# Patient Record
Sex: Male | Born: 1945 | Race: White | Hispanic: No | Marital: Married | Smoking: Former smoker
Health system: Southern US, Community
[De-identification: ages and names within clinical notes are randomized; demographics above are authoritative.]

## PROBLEM LIST (undated history)

## (undated) DIAGNOSIS — H9190 Unspecified hearing loss, unspecified ear: Secondary | ICD-10-CM

## (undated) DIAGNOSIS — M109 Gout, unspecified: Secondary | ICD-10-CM

## (undated) DIAGNOSIS — K2270 Barrett's esophagus without dysplasia: Secondary | ICD-10-CM

## (undated) DIAGNOSIS — K449 Diaphragmatic hernia without obstruction or gangrene: Secondary | ICD-10-CM

## (undated) DIAGNOSIS — K219 Gastro-esophageal reflux disease without esophagitis: Secondary | ICD-10-CM

## (undated) DIAGNOSIS — K811 Chronic cholecystitis: Secondary | ICD-10-CM

## (undated) DIAGNOSIS — Z860100 Personal history of colon polyps, unspecified: Secondary | ICD-10-CM

## (undated) HISTORY — DX: Gastro-esophageal reflux disease without esophagitis: K21.9

## (undated) HISTORY — PX: CHOLECYSTECTOMY: SHX55

## (undated) HISTORY — DX: Barrett's esophagus without dysplasia: K22.70

---
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
## 1984-12-30 HISTORY — PX: STAPEDES SURGERY: SHX789

## 2001-04-15 ENCOUNTER — Encounter: Payer: Self-pay | Admitting: Internal Medicine

## 2002-05-02 ENCOUNTER — Encounter: Payer: Self-pay | Admitting: Internal Medicine

## 2003-07-22 ENCOUNTER — Encounter: Payer: Self-pay | Admitting: Internal Medicine

## 2005-03-11 ENCOUNTER — Ambulatory Visit: Payer: Self-pay | Admitting: Endocrinology

## 2005-03-19 ENCOUNTER — Ambulatory Visit: Payer: Self-pay | Admitting: Gastroenterology

## 2005-03-21 ENCOUNTER — Ambulatory Visit: Payer: Self-pay | Admitting: Endocrinology

## 2006-05-16 ENCOUNTER — Ambulatory Visit: Payer: Self-pay | Admitting: Internal Medicine

## 2006-05-23 ENCOUNTER — Ambulatory Visit: Payer: Self-pay | Admitting: Internal Medicine

## 2006-08-25 ENCOUNTER — Ambulatory Visit: Payer: Self-pay | Admitting: Internal Medicine

## 2007-05-21 ENCOUNTER — Ambulatory Visit: Payer: Self-pay | Admitting: Internal Medicine

## 2007-05-21 LAB — CONVERTED CEMR LAB
ALT: 25 units/L (ref 0–40)
AST: 19 units/L (ref 0–37)
Albumin: 4.1 g/dL (ref 3.5–5.2)
Alkaline Phosphatase: 62 units/L (ref 39–117)
BUN: 15 mg/dL (ref 6–23)
Basophils Absolute: 0 10*3/uL (ref 0.0–0.1)
Basophils Relative: 0.3 % (ref 0.0–1.0)
Bilirubin, Direct: 0.1 mg/dL (ref 0.0–0.3)
CO2: 31 meq/L (ref 19–32)
Calcium: 9.5 mg/dL (ref 8.4–10.5)
Chloride: 105 meq/L (ref 96–112)
Cholesterol: 162 mg/dL (ref 0–200)
Creatinine, Ser: 1.2 mg/dL (ref 0.4–1.5)
Eosinophils Absolute: 0.1 10*3/uL (ref 0.0–0.6)
Eosinophils Relative: 1.5 % (ref 0.0–5.0)
GFR calc Af Amer: 80 mL/min
GFR calc non Af Amer: 66 mL/min
Glucose, Bld: 95 mg/dL (ref 70–99)
HCT: 48.2 % (ref 39.0–52.0)
HDL: 52.2 mg/dL (ref >39.0)
Hemoglobin: 16.3 g/dL (ref 13.0–17.0)
LDL Cholesterol: 89 mg/dL (ref 0–99)
Lymphocytes Relative: 24.1 % (ref 12.0–46.0)
MCHC: 33.7 g/dL (ref 30.0–36.0)
MCV: 91.5 fL (ref 78.0–100.0)
Monocytes Absolute: 0.6 10*3/uL (ref 0.2–0.7)
Monocytes Relative: 8 % (ref 3.0–11.0)
Neutro Abs: 5.3 10*3/uL (ref 1.4–7.7)
Neutrophils Relative %: 66.1 % (ref 43.0–77.0)
PSA: 0.76 ng/mL (ref 0.10–4.00)
Platelets: 248 10*3/uL (ref 150–400)
Potassium: 5 meq/L (ref 3.5–5.1)
RBC: 5.27 M/uL (ref 4.22–5.81)
RDW: 12 % (ref 11.5–14.6)
Sodium: 141 meq/L (ref 135–145)
TSH: 1.89 microintl units/mL (ref 0.35–5.50)
Total Bilirubin: 1.2 mg/dL (ref 0.3–1.2)
Total CHOL/HDL Ratio: 3.1
Total Protein: 7.2 g/dL (ref 6.0–8.3)
Triglycerides: 104 mg/dL (ref 0–149)
VLDL: 21 mg/dL (ref 0–40)
WBC: 7.9 10*3/uL (ref 4.5–10.5)

## 2007-05-27 ENCOUNTER — Ambulatory Visit: Payer: Self-pay | Admitting: Internal Medicine

## 2007-05-27 DIAGNOSIS — K219 Gastro-esophageal reflux disease without esophagitis: Secondary | ICD-10-CM

## 2007-05-27 DIAGNOSIS — E785 Hyperlipidemia, unspecified: Secondary | ICD-10-CM | POA: Insufficient documentation

## 2007-06-05 ENCOUNTER — Ambulatory Visit: Payer: Self-pay | Admitting: Internal Medicine

## 2007-12-11 ENCOUNTER — Telehealth (INDEPENDENT_AMBULATORY_CARE_PROVIDER_SITE_OTHER): Payer: Self-pay | Admitting: *Deleted

## 2008-05-20 ENCOUNTER — Ambulatory Visit: Payer: Self-pay | Admitting: Internal Medicine

## 2008-05-20 LAB — CONVERTED CEMR LAB
Albumin: 4.1 g/dL (ref 3.5–5.2)
Alkaline Phosphatase: 61 units/L (ref 39–117)
Bilirubin, Direct: 0.1 mg/dL (ref 0.0–0.3)
Blood in Urine, dipstick: NEGATIVE
Calcium: 9.5 mg/dL (ref 8.4–10.5)
Cholesterol: 157 mg/dL (ref 0–200)
Eosinophils Absolute: 0.1 10*3/uL (ref 0.0–0.7)
GFR calc Af Amer: 72 mL/min
GFR calc non Af Amer: 60 mL/min
HCT: 46.8 % (ref 39.0–52.0)
HDL: 51 mg/dL (ref >39.0)
Ketones, urine, test strip: NEGATIVE
MCHC: 33.5 g/dL (ref 30.0–36.0)
MCV: 91 fL (ref 78.0–100.0)
Monocytes Absolute: 0.6 10*3/uL (ref 0.1–1.0)
Nitrite: NEGATIVE
Platelets: 231 10*3/uL (ref 150–400)
Potassium: 4.9 meq/L (ref 3.5–5.1)
RDW: 12.3 % (ref 11.5–14.6)
Sodium: 136 meq/L (ref 135–145)
Specific Gravity, Urine: <1.005
TSH: 1.25 microintl units/mL (ref 0.35–5.50)
Total CHOL/HDL Ratio: 3.1
Triglycerides: 75 mg/dL (ref 0–149)
pH: 6.5

## 2008-06-24 ENCOUNTER — Ambulatory Visit: Payer: Self-pay | Admitting: Gastroenterology

## 2008-06-28 ENCOUNTER — Ambulatory Visit: Payer: Self-pay | Admitting: Internal Medicine

## 2008-07-08 ENCOUNTER — Ambulatory Visit: Payer: Self-pay | Admitting: Gastroenterology

## 2008-08-16 ENCOUNTER — Telehealth: Payer: Self-pay | Admitting: Internal Medicine

## 2008-08-17 ENCOUNTER — Ambulatory Visit: Payer: Self-pay | Admitting: Internal Medicine

## 2009-06-22 ENCOUNTER — Ambulatory Visit: Payer: Self-pay | Admitting: Internal Medicine

## 2009-06-22 LAB — CONVERTED CEMR LAB
ALT: 22 units/L (ref 0–53)
Alkaline Phosphatase: 71 units/L (ref 39–117)
BUN: 15 mg/dL (ref 6–23)
Bilirubin Urine: NEGATIVE
Bilirubin, Direct: 0.2 mg/dL (ref 0.0–0.3)
Blood in Urine, dipstick: NEGATIVE
CO2: 29 meq/L (ref 19–32)
Cholesterol: 148 mg/dL (ref 0–200)
GFR calc non Af Amer: 65.34 mL/min (ref >60)
Glucose, Bld: 85 mg/dL (ref 70–99)
Glucose, Urine, Semiquant: NEGATIVE
HCT: 42.9 % (ref 39.0–52.0)
Hemoglobin: 14.8 g/dL (ref 13.0–17.0)
Lymphs Abs: 1.6 10*3/uL (ref 0.7–4.0)
MCHC: 34.5 g/dL (ref 30.0–36.0)
MCV: 89.4 fL (ref 78.0–100.0)
Monocytes Relative: 9.7 % (ref 3.0–12.0)
Neutro Abs: 4.4 10*3/uL (ref 1.4–7.7)
Platelets: 179 10*3/uL (ref 150.0–400.0)
Potassium: 4.2 meq/L (ref 3.5–5.1)
RDW: 12.3 % (ref 11.5–14.6)
Sodium: 140 meq/L (ref 135–145)
Specific Gravity, Urine: 1.02
Total Protein: 6.8 g/dL (ref 6.0–8.3)
VLDL: 17 mg/dL (ref 0.0–40.0)
pH: 7

## 2009-06-26 ENCOUNTER — Telehealth: Payer: Self-pay | Admitting: Internal Medicine

## 2009-08-11 ENCOUNTER — Ambulatory Visit: Payer: Self-pay | Admitting: Internal Medicine

## 2012-10-28 ENCOUNTER — Other Ambulatory Visit: Payer: Self-pay | Admitting: Dermatology

## 2013-06-07 ENCOUNTER — Encounter: Payer: Self-pay | Admitting: Gastroenterology

## 2013-06-22 ENCOUNTER — Encounter: Payer: Self-pay | Admitting: Gastroenterology

## 2013-09-13 ENCOUNTER — Ambulatory Visit (AMBULATORY_SURGERY_CENTER): Payer: Self-pay | Admitting: *Deleted

## 2013-09-13 VITALS — Ht 71.0 in | Wt 175.0 lb

## 2013-09-13 DIAGNOSIS — Z86010 Personal history of colonic polyps: Secondary | ICD-10-CM

## 2013-09-13 MED ORDER — NA SULFATE-K SULFATE-MG SULF 17.5-3.13-1.6 GM/177ML PO SOLN: 1.0000 | Freq: Once | ORAL | Status: DC

## 2013-09-22 ENCOUNTER — Ambulatory Visit (INDEPENDENT_AMBULATORY_CARE_PROVIDER_SITE_OTHER): Payer: Medicare Other | Admitting: Gastroenterology

## 2013-09-22 ENCOUNTER — Encounter: Payer: Self-pay | Admitting: Gastroenterology

## 2013-09-22 VITALS — BP 136/90 | HR 72 | Ht 69.0 in | Wt 175.4 lb

## 2013-09-22 DIAGNOSIS — Z860100 Personal history of colon polyps, unspecified: Secondary | ICD-10-CM | POA: Insufficient documentation

## 2013-09-22 DIAGNOSIS — Z800 Family history of malignant neoplasm of digestive organs: Secondary | ICD-10-CM

## 2013-09-22 DIAGNOSIS — K219 Gastro-esophageal reflux disease without esophagitis: Secondary | ICD-10-CM

## 2013-09-22 NOTE — Patient Instructions (Addendum)

## 2013-09-22 NOTE — Assessment & Plan Note (Signed)
Patient is symptomatic despite taking daily omeprazole.  Recommendations #1 trial of dexilant 60 mg daily #2 upper endoscopy to rule out Barrett's esophagus, in view of patient's long history of  Reflux and concern about esophageal cancer with family history of esophageal cancer

## 2013-09-22 NOTE — Assessment & Plan Note (Signed)
Patient is due for a colonoscopy.

## 2013-09-22 NOTE — Progress Notes (Signed)
History of Present Illness: The patient is a 67 year old male with history of colon polyps and reflux here to discuss endoscopy.  He's had reflux for years for which he takes omeprazole.  He has at least 2 episodes of moderately severe pyrosis  weekly.  This is often followed by crampy upper abdominal pain.  He denies dysphagia.  Father died with esophageal cancer in his early 104s.  He developed colon cancer in his mid 76s.  Adenomatous polyps were removed in 2004.  2009 colonoscopy was normal.    Past Medical History  Diagnosis Date  . GERD (gastroesophageal reflux disease)   . Hyperlipidemia   . Colon polyps    Past Surgical History  Procedure Laterality Date  . Orif shoulder fracture Right 1965  . Stapedes surgery Right 1986  . Tonsillectomy  1954   family history includes Cancer in his maternal grandfather; Colon cancer (age of onset: 68) in his father; Esophageal cancer in his father; Hypertension in his mother. Current Outpatient Prescriptions  Medication Sig Dispense Refill  . fish oil-omega-3 fatty acids 1000 MG capsule Take 1 g by mouth daily.      . Multiple Vitamin (MULTIVITAMIN) tablet Take 1 tablet by mouth daily.      Marland Kitchen omeprazole (PRILOSEC) 20 MG capsule Take 20 mg by mouth daily.       No current facility-administered medications for this visit.   Allergies as of 09/22/2013  . (No Known Allergies)    reports that he has quit smoking. His smoking use included Pipe. He started smoking about 34 years ago. He has never used smokeless tobacco. He reports that  drinks alcohol. He reports that he does not use illicit drugs.     Review of Systems: Pertinent positive and negative review of systems were noted in the above HPI section. All other review of systems were otherwise negative.  Vital signs were reviewed in today's medical record Physical Exam: General: Well developed , well nourished, no acute distress Skin: anicteric Head: Normocephalic and atraumatic Eyes:   sclerae anicteric, EOMI Ears: Normal auditory acuity Mouth: No deformity or lesions Neck: Supple, no masses or thyromegaly Lungs: Clear throughout to auscultation Heart: Regular rate and rhythm; no murmurs, rubs or bruits Abdomen: Soft, non tender and non distended. No masses, hepatosplenomegaly or hernias noted. Normal Bowel sounds Rectal:deferred Musculoskeletal: Symmetrical with no gross deformities  Skin: No lesions on visible extremities Pulses:  Normal pulses noted Extremities: No clubbing, cyanosis, edema or deformities noted Neurological: Alert oriented x 4, grossly nonfocal Cervical Nodes:  No significant cervical adenopathy Inguinal Nodes: No significant inguinal adenopathy Psychological:  Alert and cooperative. Normal mood and affect

## 2013-09-27 ENCOUNTER — Encounter: Payer: Self-pay | Admitting: Gastroenterology

## 2013-09-27 ENCOUNTER — Ambulatory Visit (AMBULATORY_SURGERY_CENTER): Payer: Medicare Other | Admitting: Gastroenterology

## 2013-09-27 VITALS — BP 121/72 | HR 49 | Temp 96.2°F | Resp 18 | Ht 69.0 in | Wt 175.0 lb

## 2013-09-27 DIAGNOSIS — D131 Benign neoplasm of stomach: Secondary | ICD-10-CM

## 2013-09-27 DIAGNOSIS — K317 Polyp of stomach and duodenum: Secondary | ICD-10-CM

## 2013-09-27 DIAGNOSIS — K219 Gastro-esophageal reflux disease without esophagitis: Secondary | ICD-10-CM

## 2013-09-27 DIAGNOSIS — Z860100 Personal history of colon polyps, unspecified: Secondary | ICD-10-CM

## 2013-09-27 DIAGNOSIS — Z800 Family history of malignant neoplasm of digestive organs: Secondary | ICD-10-CM

## 2013-09-27 DIAGNOSIS — D126 Benign neoplasm of colon, unspecified: Secondary | ICD-10-CM

## 2013-09-27 DIAGNOSIS — K2270 Barrett's esophagus without dysplasia: Secondary | ICD-10-CM

## 2013-09-27 HISTORY — PX: COLONOSCOPY: SHX174

## 2013-09-27 MED ORDER — SODIUM CHLORIDE 0.9 % IV SOLN: 500.0000 mL | INTRAVENOUS | Status: DC

## 2013-09-27 MED ORDER — DEXLANSOPRAZOLE 60 MG PO CPDR: 60.0000 mg | DELAYED_RELEASE_CAPSULE | Freq: Every day | ORAL | Status: DC

## 2013-09-27 NOTE — Op Note (Signed)
[facility] [address] [place], [address]   ENDOSCOPY PROCEDURE REPORT  PATIENT: Ronald Mccormick, Ronald Mccormick.  MR#: [id number] BIRTHDATE: Apr 08, 1946 , 67  yrs. old GENDER: Male ENDOSCOPIST: Louis Meckel, MD REFERRED BY:  Benedetto Goad, MD PROCEDURE DATE:  09/27/2013 PROCEDURE:  EGD w/ biopsy ASA CLASS:     Class II INDICATIONS:  Barrett's screening. MEDICATIONS: There was residual sedation effect present from prior procedure, MAC sedation, administered by CRNA, and propofol (Diprivan) 100mg  IV TOPICAL ANESTHETIC:  DESCRIPTION OF PROCEDURE: After the risks benefits and alternatives of the procedure were thoroughly explained, informed consent was obtained.  The LB [id number] endoscope was introduced through the mouth and advanced to the third portion of the duodenum. Without limitations.  The instrument was slowly withdrawn as the mucosa was fully examined.      In the gastric cardia, fundus and body there were multiple inflammatory-appearing polyps measuring up to 12 mm.  Biopsies were taken. Z line was at approximately 34 cm and was irregular.  Gastric appearing epithelium was noted to up to this point.  Multiple biopsies were taken to rule out Barrett's esophagus. A small sliding hiatal hernia was present.   In the gastric cardia, fundus and body there were multiple inflammatory-appearing polyps measuring up to 12 mm.  Biopsies were taken. Re   The remainder of the exam was normal  COMPLICATIONS: There were no complications. ENDOSCOPIC IMPRESSION: 1.  multiple gastric polyps 2.  possible Barrett's esophagus 3.   The remainder of the upper endoscopy exam was otherwise normal  RECOMMENDATIONS: await biopsy findings REPEAT EXAM:  eSigned:  Louis Meckel, MD 09/27/2013 9:23 AM   CC:  PATIENT NAME:  Ronald Mccormick. MR#: [id number]

## 2013-09-27 NOTE — Progress Notes (Signed)
Called to room to assist during endoscopic procedure.  Patient ID and intended procedure confirmed with present staff. Received instructions for my participation in the procedure from the performing physician.  

## 2013-09-27 NOTE — Progress Notes (Signed)
Patient did not experience any of the following events: a burn prior to discharge; a fall within the facility; wrong site/side/patient/procedure/implant event; or a hospital transfer or hospital admission upon discharge from the facility. (G8907) Patient did not have preoperative order for IV antibiotic SSI prophylaxis. (G8918)  

## 2013-09-27 NOTE — Patient Instructions (Addendum)
YOU HAD AN ENDOSCOPIC PROCEDURE TODAY AT THE [facility]: Refer to the procedure report that was given to you for any specific questions about what was found during the examination.  If the procedure report does not answer your questions, please call your gastroenterologist to clarify.  If you requested that your care partner not be given the details of your procedure findings, then the procedure report has been included in a sealed envelope for you to review at your convenience later.  YOU SHOULD EXPECT: Some feelings of bloating in the abdomen. Passage of more gas than usual.  Walking can help get rid of the air that was put into your GI tract during the procedure and reduce the bloating. If you had a lower endoscopy (such as a colonoscopy or flexible sigmoidoscopy) you may notice spotting of blood in your stool or on the toilet paper. If you underwent a bowel prep for your procedure, then you may not have a normal bowel movement for a few days.  DIET: Your first meal following the procedure should be a light meal and then it is ok to progress to your normal diet.  A half-sandwich or bowl of soup is an example of a good first meal.  Heavy or fried foods are harder to digest and may make you feel nauseous or bloated.  Likewise meals heavy in dairy and vegetables can cause extra gas to form and this can also increase the bloating.  Drink plenty of fluids but you should avoid alcoholic beverages for 24 hours.  ACTIVITY: Your care partner should take you home directly after the procedure.  You should plan to take it easy, moving slowly for the rest of the day.  You can resume normal activity the day after the procedure however you should NOT DRIVE or use heavy machinery for 24 hours (because of the sedation medicines used during the test).    SYMPTOMS TO REPORT IMMEDIATELY: A gastroenterologist can be reached at any hour.  During normal business hours, 8:30 AM to 5:00 PM Monday through Friday,  call [phone].  After hours and on weekends, please call the GI answering service at [phone] who will take a message and have the physician on call contact you.   Following lower endoscopy (colonoscopy or flexible sigmoidoscopy):  Excessive amounts of blood in the stool  Significant tenderness or worsening of abdominal pains  Swelling of the abdomen that is new, acute  Fever of 100F or higher  Following upper endoscopy (EGD)  Vomiting of blood or coffee ground material  New chest pain or pain under the shoulder blades  Painful or persistently difficult swallowing  New shortness of breath  Fever of 100F or higher  Black, tarry-looking stools  FOLLOW UP: If any biopsies were taken you will be contacted by phone or by letter within the next 1-3 weeks.  Call your gastroenterologist if you have not heard about the biopsies in 3 weeks.  Our staff will call the home number listed on your records the next business day following your procedure to check on you and address any questions or concerns that you may have at that time regarding the information given to you following your procedure. This is a courtesy call and so if there is no answer at the home number and we have not heard from you through the emergency physician on call, we will assume that you have returned to your regular daily activities without incident.  SIGNATURES/CONFIDENTIALITY: You and/or your care   partner have signed paperwork which will be entered into your electronic medical record.  These signatures attest to the fact that that the information above on your After Visit Summary has been reviewed and is understood.  Full responsibility of the confidentiality of this discharge information lies with you and/or your care-partner.   Resume medications. Information given on polyps with discharge instructions. 

## 2013-09-27 NOTE — Op Note (Signed)
[facility] [address] [place], [address]   COLONOSCOPY PROCEDURE REPORT  PATIENT: Ronald, Mccormick.  MR#: [id number] BIRTHDATE: 1946-11-05 , 67  yrs. old GENDER: Male ENDOSCOPIST: Louis Meckel, MD REFERRED NF:AOZH Andrey Campanile, MD PROCEDURE DATE:  09/27/2013 PROCEDURE:   Colonoscopy with cold biopsy polypectomy First Screening Colonoscopy - Avg.  risk and is 50 yrs.  old or older - No.  Prior Negative Screening - Now for repeat screening. N/A  History of Adenoma - Now for follow-up colonoscopy & has been > or = to 3 yrs.  Yes hx of adenoma.  Has been 3 or more years since last colonoscopy.  Polyps Removed Today? Yes. ASA CLASS:   Class II INDICATIONS:Patient's immediate family history of colon cancer, Patient's personal history of adenomatous colon polyps, and 2004-adenomatous polyps.  2009 colonoscopy negative. MEDICATIONS: MAC sedation, administered by CRNA and propofol (Diprivan) 200mg  IV  DESCRIPTION OF PROCEDURE:   After the risks benefits and alternatives of the procedure were thoroughly explained, informed consent was obtained.  A digital rectal exam revealed no abnormalities of the rectum.   The LB [id number]  endoscope was introduced through the anus and advanced to the cecum, which was identified by both the appendix and ileocecal valve. No adverse events experienced.   The quality of the prep was excellent using Suprep  The instrument was then slowly withdrawn as the colon was fully examined.      COLON FINDINGS: A sessile polyp measuring 2 mm in size was found in the rectum.  A polypectomy was performed with cold forceps.   The colon mucosa was otherwise normal.  Retroflexed views revealed no abnormalities. The time to cecum=2 minutes 43 seconds.  Withdrawal time=9 minutes 34 seconds.  The scope was withdrawn and the procedure completed. COMPLICATIONS: There were no complications.  ENDOSCOPIC IMPRESSION: 1.   Sessile polyp measuring  2 mm in size was found in the rectum; polypectomy was performed with cold forceps 2.   The colon mucosa was otherwise normal  RECOMMENDATIONS: Given your significant family history of colon cancer, you should have a repeat colonoscopy in 5 years   eSigned:  Louis Meckel, MD 09/27/2013 9:17 AM   cc:

## 2013-09-28 ENCOUNTER — Telehealth: Payer: Self-pay | Admitting: *Deleted

## 2013-09-28 NOTE — Telephone Encounter (Signed)
  Follow up Call-  Call back number 09/27/2013  Post procedure Call Back phone  # [phone]  Permission to leave phone message Yes     Patient questions:  Do you have a fever, pain , or abdominal swelling? no Pain Score  0 *  Have you tolerated food without any problems? yes  Have you been able to return to your normal activities? yes  Do you have any questions about your discharge instructions: Diet   no Medications  no Follow up visit  no  Do you have questions or concerns about your Care? no  Actions: * If pain score is 4 or above: No action needed, pain <4.

## 2013-10-04 ENCOUNTER — Encounter: Payer: Self-pay | Admitting: Gastroenterology

## 2013-11-04 ENCOUNTER — Other Ambulatory Visit: Payer: Self-pay

## 2014-05-21 ENCOUNTER — Encounter (HOSPITAL_COMMUNITY): Payer: Self-pay | Admitting: Emergency Medicine

## 2014-05-21 ENCOUNTER — Emergency Department (HOSPITAL_COMMUNITY): Payer: Medicare Other

## 2014-05-21 ENCOUNTER — Inpatient Hospital Stay (HOSPITAL_COMMUNITY)
Admission: EM | Admit: 2014-05-21 | Discharge: 2014-05-24 | DRG: 287 | Disposition: A | Discharge status: Home / Self Care | Payer: Medicare Other | Attending: Cardiovascular Disease | Admitting: Cardiovascular Disease

## 2014-05-21 DIAGNOSIS — E785 Hyperlipidemia, unspecified: Secondary | ICD-10-CM | POA: Diagnosis present

## 2014-05-21 DIAGNOSIS — I200 Unstable angina: Secondary | ICD-10-CM | POA: Diagnosis present

## 2014-05-21 DIAGNOSIS — Z800 Family history of malignant neoplasm of digestive organs: Secondary | ICD-10-CM | POA: Diagnosis not present

## 2014-05-21 DIAGNOSIS — I2510 Atherosclerotic heart disease of native coronary artery without angina pectoris: Principal | ICD-10-CM | POA: Diagnosis present

## 2014-05-21 DIAGNOSIS — K449 Diaphragmatic hernia without obstruction or gangrene: Secondary | ICD-10-CM | POA: Diagnosis not present

## 2014-05-21 DIAGNOSIS — E7800 Pure hypercholesterolemia, unspecified: Secondary | ICD-10-CM | POA: Diagnosis present

## 2014-05-21 DIAGNOSIS — Z87891 Personal history of nicotine dependence: Secondary | ICD-10-CM

## 2014-05-21 DIAGNOSIS — Z8249 Family history of ischemic heart disease and other diseases of the circulatory system: Secondary | ICD-10-CM

## 2014-05-21 DIAGNOSIS — Z79899 Other long term (current) drug therapy: Secondary | ICD-10-CM | POA: Diagnosis not present

## 2014-05-21 DIAGNOSIS — Z860100 Personal history of colon polyps, unspecified: Secondary | ICD-10-CM

## 2014-05-21 DIAGNOSIS — N182 Chronic kidney disease, stage 2 (mild): Secondary | ICD-10-CM | POA: Diagnosis present

## 2014-05-21 DIAGNOSIS — K219 Gastro-esophageal reflux disease without esophagitis: Secondary | ICD-10-CM | POA: Diagnosis present

## 2014-05-21 DIAGNOSIS — Z823 Family history of stroke: Secondary | ICD-10-CM | POA: Diagnosis not present

## 2014-05-21 DIAGNOSIS — I249 Acute ischemic heart disease, unspecified: Secondary | ICD-10-CM | POA: Diagnosis present

## 2014-05-21 DIAGNOSIS — R079 Chest pain, unspecified: Secondary | ICD-10-CM | POA: Diagnosis present

## 2014-05-21 DIAGNOSIS — Z7982 Long term (current) use of aspirin: Secondary | ICD-10-CM | POA: Diagnosis not present

## 2014-05-21 LAB — LIPASE, BLOOD: Lipase: 28 U/L (ref 11–59)

## 2014-05-21 LAB — COMPREHENSIVE METABOLIC PANEL
ALT: 16 U/L (ref 0–53)
AST: 17 U/L (ref 0–37)
Albumin: 3.7 g/dL (ref 3.5–5.2)
Alkaline Phosphatase: 84 U/L (ref 39–117)
BUN: 18 mg/dL (ref 6–23)
CALCIUM: 9.6 mg/dL (ref 8.4–10.5)
CHLORIDE: 101 meq/L (ref 96–112)
CO2: 21 meq/L (ref 19–32)
Creatinine, Ser: 1.33 mg/dL (ref 0.50–1.35)
GFR calc Af Amer: 63 mL/min — ABNORMAL LOW (ref >90)
GFR, EST NON AFRICAN AMERICAN: 54 mL/min — AB (ref >90)
Glucose, Bld: 133 mg/dL — ABNORMAL HIGH (ref 70–99)
Potassium: 4.6 mEq/L (ref 3.7–5.3)
SODIUM: 139 meq/L (ref 137–147)
Total Bilirubin: 0.3 mg/dL (ref 0.3–1.2)
Total Protein: 7.4 g/dL (ref 6.0–8.3)

## 2014-05-21 LAB — CBC WITH DIFFERENTIAL/PLATELET
Basophils Absolute: 0 10*3/uL (ref 0.0–0.1)
Basophils Relative: 0 % (ref 0–1)
EOS PCT: 1 % (ref 0–5)
Eosinophils Absolute: 0 10*3/uL (ref 0.0–0.7)
HCT: 44.8 % (ref 39.0–52.0)
Hemoglobin: 15 g/dL (ref 13.0–17.0)
LYMPHS PCT: 22 % (ref 12–46)
Lymphs Abs: 1.8 10*3/uL (ref 0.7–4.0)
MCH: 29.4 pg (ref 26.0–34.0)
MCHC: 33.5 g/dL (ref 30.0–36.0)
MCV: 87.7 fL (ref 78.0–100.0)
Monocytes Absolute: 0.5 10*3/uL (ref 0.1–1.0)
Monocytes Relative: 6 % (ref 3–12)
NEUTROS ABS: 5.9 10*3/uL (ref 1.7–7.7)
Neutrophils Relative %: 71 % (ref 43–77)
PLATELETS: 284 10*3/uL (ref 150–400)
RBC: 5.11 MIL/uL (ref 4.22–5.81)
RDW: 12.8 % (ref 11.5–15.5)
WBC: 8.2 10*3/uL (ref 4.0–10.5)

## 2014-05-21 LAB — TROPONIN I
Troponin I: <0.3 ng/mL (ref <0.30)
Troponin I: <0.3 ng/mL (ref <0.30)
Troponin I: <0.3 ng/mL (ref <0.30)

## 2014-05-21 LAB — I-STAT TROPONIN, ED: TROPONIN I, POC: 0 ng/mL (ref 0.00–0.08)

## 2014-05-21 LAB — HEPARIN LEVEL (UNFRACTIONATED): HEPARIN UNFRACTIONATED: 0.53 [IU]/mL (ref 0.30–0.70)

## 2014-05-21 LAB — MRSA PCR SCREENING: MRSA by PCR: NEGATIVE

## 2014-05-21 MED ORDER — MORPHINE SULFATE 4 MG/ML IJ SOLN
4.0000 mg | INTRAMUSCULAR | Status: DC | PRN
  Administered 2014-05-21: 4 mg via INTRAVENOUS
  Filled 2014-05-21: qty 1

## 2014-05-21 MED ORDER — ASPIRIN EC 81 MG PO TBEC
81.0000 mg | DELAYED_RELEASE_TABLET | Freq: Every day | ORAL | Status: DC
  Administered 2014-05-22 – 2014-05-24 (×3): 81 mg via ORAL
  Filled 2014-05-21 (×3): qty 1

## 2014-05-21 MED ORDER — ATORVASTATIN CALCIUM 40 MG PO TABS
40.0000 mg | ORAL_TABLET | Freq: Every day | ORAL | Status: DC
  Administered 2014-05-21 – 2014-05-24 (×4): 40 mg via ORAL
  Filled 2014-05-21 (×4): qty 1

## 2014-05-21 MED ORDER — NITROGLYCERIN 0.4 MG SL SUBL: 0.4000 mg | SUBLINGUAL_TABLET | SUBLINGUAL | Status: DC | PRN

## 2014-05-21 MED ORDER — ASPIRIN 81 MG PO CHEW: 324.0000 mg | CHEWABLE_TABLET | ORAL | Status: AC

## 2014-05-21 MED ORDER — ADULT MULTIVITAMIN W/MINERALS CH
1.0000 | ORAL_TABLET | Freq: Every day | ORAL | Status: DC
  Administered 2014-05-22 – 2014-05-24 (×3): 1 via ORAL
  Filled 2014-05-21 (×3): qty 1

## 2014-05-21 MED ORDER — IOHEXOL 350 MG/ML SOLN
100.0000 mL | Freq: Once | INTRAVENOUS | Status: AC | PRN
  Administered 2014-05-21: 100 mL via INTRAVENOUS

## 2014-05-21 MED ORDER — ONDANSETRON HCL 4 MG/2ML IJ SOLN: 4.0000 mg | Freq: Four times a day (QID) | INTRAMUSCULAR | Status: DC | PRN

## 2014-05-21 MED ORDER — METOPROLOL TARTRATE 25 MG PO TABS
25.0000 mg | ORAL_TABLET | Freq: Two times a day (BID) | ORAL | Status: DC
  Administered 2014-05-21 – 2014-05-24 (×6): 25 mg via ORAL
  Filled 2014-05-21 (×7): qty 1

## 2014-05-21 MED ORDER — HEPARIN (PORCINE) IN NACL 100-0.45 UNIT/ML-% IJ SOLN
950.0000 [IU]/h | INTRAMUSCULAR | Status: DC
  Administered 2014-05-21 – 2014-05-23 (×2): 950 [IU]/h via INTRAVENOUS
  Filled 2014-05-21 (×4): qty 250

## 2014-05-21 MED ORDER — ACETAMINOPHEN 325 MG PO TABS: 650.0000 mg | ORAL_TABLET | ORAL | Status: DC | PRN

## 2014-05-21 MED ORDER — ONDANSETRON HCL 4 MG/2ML IJ SOLN
4.0000 mg | Freq: Once | INTRAMUSCULAR | Status: AC
  Administered 2014-05-21: 4 mg via INTRAVENOUS
  Filled 2014-05-21: qty 2

## 2014-05-21 MED ORDER — ONE-DAILY MULTI VITAMINS PO TABS: 1.0000 | ORAL_TABLET | Freq: Every day | ORAL | Status: DC

## 2014-05-21 MED ORDER — HEPARIN BOLUS VIA INFUSION
4000.0000 [IU] | Freq: Once | INTRAVENOUS | Status: AC
  Administered 2014-05-21: 4000 [IU] via INTRAVENOUS
  Filled 2014-05-21: qty 4000

## 2014-05-21 MED ORDER — SODIUM CHLORIDE 0.9 % IV SOLN
INTRAVENOUS | Status: DC
  Administered 2014-05-23 – 2014-05-24 (×3): via INTRAVENOUS

## 2014-05-21 MED ORDER — DEXLANSOPRAZOLE 60 MG PO CPDR
60.0000 mg | DELAYED_RELEASE_CAPSULE | Freq: Every day | ORAL | Status: DC
  Administered 2014-05-22 – 2014-05-24 (×3): 60 mg via ORAL
  Filled 2014-05-21 (×4): qty 1

## 2014-05-21 MED ORDER — NON FORMULARY: 60.0000 mg | Freq: Every morning | Status: DC

## 2014-05-21 MED ORDER — ASPIRIN 300 MG RE SUPP
300.0000 mg | RECTAL | Status: AC
  Filled 2014-05-21: qty 1

## 2014-05-21 NOTE — Progress Notes (Signed)
ANTICOAGULATION CONSULT NOTE - Initial Consult  Pharmacy Consult for Heparin Indication: chest pain/ACS  No Known Allergies  Patient Measurements: Height: 5\' 10"  (177.8 cm) Weight: 174 lb (78.926 kg) IBW/kg (Calculated) : 73  Vital Signs: Temp: 97.7 F (36.5 C) (05/23 1445) Temp src: Oral (05/23 1300) BP: 169/94 mmHg (05/23 1445) Pulse Rate: 59 (05/23 1445)  Labs:  Recent Labs  05/21/14 0931 05/21/14 0936  HGB  --  15.0  HCT  --  44.8  PLT  --  284  CREATININE  --  1.33  TROPONINI <0.30  --     Estimated Creatinine Clearance: 56.4 ml/min (by C-G formula based on Cr of 1.33).   Medical History: Past Medical History  Diagnosis Date  . GERD (gastroesophageal reflux disease)   . Hyperlipidemia   . Colon polyps     Medications:  Scheduled:  . aspirin  324 mg Oral NOW   Or  . aspirin  300 mg Rectal NOW  . [START ON 05/22/2014] aspirin EC  81 mg Oral Daily  . atorvastatin  40 mg Oral q1800  . dexlansoprazole  60 mg Oral Daily  . metoprolol tartrate  25 mg Oral BID  . [START ON 05/22/2014] multivitamin with minerals  1 tablet Oral Daily   Infusions:  . sodium chloride      Assessment: 68 yo M presenting on 5/23 with pain between shoulder blades to start heparin drip for r/o ACS. CBC wnl.  Goal of Therapy:  Heparin level 0.3-0.7 units/ml Monitor platelets by anticoagulation protocol: Yes   Plan:  - Heparin bolus 4000 units x 1, followed by 950 units/hr (~12 u/kg/hr) - F/u 6 hr HL, then daily HL - Monitor CBC, s/s bleeding  Harolyn Rutherford, PharmD Clinical Pharmacist - Resident Pager: [phone] Pharmacy: [phone] 05/21/2014 3:26 PM

## 2014-05-21 NOTE — H&P (Signed)
Referring Physician:  BAIN WHICHARD is an 68 y.o. male.                       Chief Complaint: Chest pain HPI:  68 year old male with pain between his shoulder blades started this AM. Had heavy exertion yesterday. The pain began to radiate to his bilateral axilla. None to the center anterior chest. No radiation to the neck or jaw. No upper extremity pain radiation. He was not short of breath. He was nauseated. His nausea is improved. His pain has not. No past similar episodes. He takes dsxilant for reflux. It in no way feels like his reflux. No recent exertional pain. No falls injuries or trauma. No recent food intolerance. Currently non smoker, no alcohol use.    Past Medical History  Diagnosis Date  . GERD (gastroesophageal reflux disease)   . Hyperlipidemia   . Colon polyps       Past Surgical History  Procedure Laterality Date  . Orif shoulder fracture Right 1965  . Stapedes surgery Right 1986  . Tonsillectomy  1954    Family History  Problem Relation Age of Onset  . Colon cancer Father 92  . Esophageal cancer Father   . Hypertension Mother   . Stroke Mother   . Cancer Maternal Grandfather     mets   Social History:  reports that he has quit smoking. His smoking use included Pipe. He started smoking about 35 years ago. He has never used smokeless tobacco. He reports that he drinks about 8.4 ounces of alcohol per week. He reports that he does not use illicit drugs.  Allergies: No Known Allergies   (Not in a hospital admission)  Results for orders placed during the hospital encounter of 05/21/14 (from the past 48 hour(s))  TROPONIN I     Status: None   Collection Time    05/21/14  9:31 AM      Result Value Ref Range   Troponin I <0.30  <0.30 ng/mL   Comment:            Due to the release kinetics of cTnI,     a negative result within the first hours     of the onset of symptoms does not rule out     myocardial infarction with certainty.     If myocardial  infarction is still suspected,     repeat the test at appropriate intervals.  CBC WITH DIFFERENTIAL     Status: None   Collection Time    05/21/14  9:36 AM      Result Value Ref Range   WBC 8.2  4.0 - 10.5 K/uL   RBC 5.11  4.22 - 5.81 MIL/uL   Hemoglobin 15.0  13.0 - 17.0 g/dL   HCT 44.8  39.0 - 52.0 %   MCV 87.7  78.0 - 100.0 fL   MCH 29.4  26.0 - 34.0 pg   MCHC 33.5  30.0 - 36.0 g/dL   RDW 12.8  11.5 - 15.5 %   Platelets 284  150 - 400 K/uL   Neutrophils Relative % 71  43 - 77 %   Neutro Abs 5.9  1.7 - 7.7 K/uL   Lymphocytes Relative 22  12 - 46 %   Lymphs Abs 1.8  0.7 - 4.0 K/uL   Monocytes Relative 6  3 - 12 %   Monocytes Absolute 0.5  0.1 - 1.0 K/uL   Eosinophils Relative 1  0 - 5 %   Eosinophils Absolute 0.0  0.0 - 0.7 K/uL   Basophils Relative 0  0 - 1 %   Basophils Absolute 0.0  0.0 - 0.1 K/uL  COMPREHENSIVE METABOLIC PANEL     Status: Abnormal   Collection Time    05/21/14  9:36 AM      Result Value Ref Range   Sodium 139  137 - 147 mEq/L   Potassium 4.6  3.7 - 5.3 mEq/L   Chloride 101  96 - 112 mEq/L   CO2 21  19 - 32 mEq/L   Glucose, Bld 133 (*) 70 - 99 mg/dL   BUN 18  6 - 23 mg/dL   Creatinine, Ser 1.33  0.50 - 1.35 mg/dL   Calcium 9.6  8.4 - 10.5 mg/dL   Total Protein 7.4  6.0 - 8.3 g/dL   Albumin 3.7  3.5 - 5.2 g/dL   AST 17  0 - 37 U/L   ALT 16  0 - 53 U/L   Alkaline Phosphatase 84  39 - 117 U/L   Total Bilirubin 0.3  0.3 - 1.2 mg/dL   GFR calc non Af Amer 54 (*) >90 mL/min   GFR calc Af Amer 63 (*) >90 mL/min   Comment: (NOTE)     The eGFR has been calculated using the CKD EPI equation.     This calculation has not been validated in all clinical situations.     eGFR's persistently <90 mL/min signify possible Chronic Kidney     Disease.  LIPASE, BLOOD     Status: None   Collection Time    05/21/14  9:36 AM      Result Value Ref Range   Lipase 28  11 - 59 U/L  I-STAT TROPOININ, ED     Status: None   Collection Time    05/21/14  1:07 PM       Result Value Ref Range   Troponin i, poc 0.00  0.00 - 0.08 ng/mL   Comment 3            Comment: Due to the release kinetics of cTnI,     a negative result within the first hours     of the onset of symptoms does not rule out     myocardial infarction with certainty.     If myocardial infarction is still suspected,     repeat the test at appropriate intervals.   Ct Angio Chest Aorta W/cm &/or Wo/cm  05/21/2014   CLINICAL DATA:  Upper back pain extending to the chest  EXAM: CT ANGIOGRAPHY CHEST, ABDOMEN AND PELVIS  TECHNIQUE: Multidetector CT imaging through the chest, abdomen and pelvis was performed using the standard protocol during bolus administration of intravenous contrast. Multiplanar reconstructed images and MIPs were obtained and reviewed to evaluate the vascular anatomy.  CONTRAST:  138m OMNIPAQUE IOHEXOL 350 MG/ML SOLN  COMPARISON:  None.  FINDINGS: CTA CHEST FINDINGS  Mediastinum: Unremarkable CT appearance of the thyroid gland. No suspicious mediastinal or hilar adenopathy. No soft tissue mediastinal mass. Small hiatal hernia.  Heart/Vascular: No evidence of acute intramural hematoma non contrasted images. Conventional 3 vessel aortic arch. There is a ectasia of the origins of the great vessels. Specifically, the origin of the left subclavian artery is 1.6 cm in diameter. The ascending thoracic aorta is also ectatic with a maximal diameter of 4.1 cm just proximal to the transverse segment. The aortic root is within normal limits. No effacement of the  sino-tubular junction. Cardiac structures are within normal limits for size. No pericardial effusion. Mild atherosclerotic calcification present in left anterior descending coronary artery. No large central PE. Main and central pulmonary arteries are within normal limits for size.  Lungs/Pleura: Small left Bochdalek's hernia containing retroperitoneal fat. Biapical pleural parenchymal scarring with emphysematous change. Mild paraseptal emphysema  confined to the upper lobes. 6 mm nodular opacity in the right middle lobe (image 76 series 6) dependent atelectasis noted in the lower lobes bilaterally. Respiratory motion slightly limits evaluation for nodules in the bases.  Bones/Soft Tissues: No acute fracture or aggressive appearing lytic or blastic osseous lesion.  Review of the MIP images confirms the above findings.  CTA ABDOMEN AND PELVIS FINDINGS  VASCULAR  Aorta: Normal caliber aorta without aneurysmal dilatation, dissection or significant atherosclerotic vascular plaque.  Celiac: Widely patent. Conventional hepatic arterial anatomy. No aneurysmal dilatation.  SMA: Widely patent.  Renals: 2 left and single right renal arteries are patent and unremarkable. No changes of fibromuscular dysplasia or aneurysmal dilatation.  IMA: Widely patent and unremarkable.  Inflow: Widely patent and unremarkable  Proximal Outflow: Widely patent and unremarkable  Veins: No focal venous abnormality.  Review of the MIP images confirms the above findings.  NON-VASCULAR  Abdomen: Unremarkable CT appearance of the stomach, duodenum, spleen, adrenal glands and pancreas. Normal hepatic contours and morphology. No discrete hepatic lesion. A single stone is present within the gallbladder neck. No secondary signs to suggest acute cholecystitis. No intra or extrahepatic biliary ductal dilatation.  Unremarkable appearance of the bilateral kidneys. No focal solid lesion, hydronephrosis or nephrolithiasis. 2.1 cm exophytic simple cyst from the lower pole of the left kidney No evidence of obstruction or focal bowel wall thickening. Normal appendix in the right lower quadrant. The terminal ileum is unremarkable. No free fluid or suspicious adenopathy.  Pelvis: Prostatomegaly. The prostate gland measures 5.4 cm transversely. The bladder is markedly distended with urine. Unremarkable seminal vesicles. Small bilateral fat containing inguinal hernias.  Bones/Soft Tissues: No acute fracture or  aggressive appearing lytic or blastic osseous lesion. Circumscribed 9 mm sclerotic focus in the right iliac bone just above the acetabulum has a narrow zone of transition and is favored to represent a benign bone island. Mild L4-L5, and L5-S1 degenerative disc disease.  IMPRESSION: VASCULAR:  1. Negative for acute aortic pathology. 2. Mild ectasia of the ascending thoracic aorta to a maximal diameter of 4.1 cm. 3. Mild ectasia of the origin of the left subclavian artery to 1.6 cm. NON VASCULAR  1. No acute abnormality. 2. Nonspecific 6 mm right middle lobe nodular opacity is favored to reflect a small focus of scarring. If the patient is at high risk for bronchogenic carcinoma, follow-up chest CT at 6-12 months is recommended. If the patient is at low risk for bronchogenic carcinoma, follow-up chest CT at 12 months is recommended. This recommendation follows the consensus statement: Guidelines for Management of Small Pulmonary Nodules Detected on CT Scans: A Statement from the [place] as published in Radiology 2005;237:395-400. 3. Mild by apical paraseptal emphysema and pleural parenchymal scarring. 4. Cholelithiasis without evidence of acute cholecystitis. 5. The bladder is markedly distended with urine. 6. Small bilateral fat containing inguinal hernias and left Bochdalek's hernia. 7. Circumscribed 9 mm sclerotic focus in the right iliac bone just superior to the acetabulum is favored to represent a benign bone island or other fibro-osseous lesion. However, if the patient has a known history of prostate cancer, a blastic metastasis would be difficult to  exclude entirely. 8. Small hiatal hernia Signed,  Criselda Peaches, MD  [facility]  [facility]   Electronically Signed   By: Jacqulynn Cadet M.D.   On: 05/21/2014 12:04   Ct Angio Abd/pel W/ And/or W/o  05/21/2014   CLINICAL DATA:  Upper back pain extending to the chest  EXAM: CT ANGIOGRAPHY CHEST,  ABDOMEN AND PELVIS  TECHNIQUE: Multidetector CT imaging through the chest, abdomen and pelvis was performed using the standard protocol during bolus administration of intravenous contrast. Multiplanar reconstructed images and MIPs were obtained and reviewed to evaluate the vascular anatomy.  CONTRAST:  173m OMNIPAQUE IOHEXOL 350 MG/ML SOLN  COMPARISON:  None.  FINDINGS: CTA CHEST FINDINGS  Mediastinum: Unremarkable CT appearance of the thyroid gland. No suspicious mediastinal or hilar adenopathy. No soft tissue mediastinal mass. Small hiatal hernia.  Heart/Vascular: No evidence of acute intramural hematoma non contrasted images. Conventional 3 vessel aortic arch. There is a ectasia of the origins of the great vessels. Specifically, the origin of the left subclavian artery is 1.6 cm in diameter. The ascending thoracic aorta is also ectatic with a maximal diameter of 4.1 cm just proximal to the transverse segment. The aortic root is within normal limits. No effacement of the sino-tubular junction. Cardiac structures are within normal limits for size. No pericardial effusion. Mild atherosclerotic calcification present in left anterior descending coronary artery. No large central PE. Main and central pulmonary arteries are within normal limits for size.  Lungs/Pleura: Small left Bochdalek's hernia containing retroperitoneal fat. Biapical pleural parenchymal scarring with emphysematous change. Mild paraseptal emphysema confined to the upper lobes. 6 mm nodular opacity in the right middle lobe (image 76 series 6) dependent atelectasis noted in the lower lobes bilaterally. Respiratory motion slightly limits evaluation for nodules in the bases.  Bones/Soft Tissues: No acute fracture or aggressive appearing lytic or blastic osseous lesion.  Review of the MIP images confirms the above findings.  CTA ABDOMEN AND PELVIS FINDINGS  VASCULAR  Aorta: Normal caliber aorta without aneurysmal dilatation, dissection or significant  atherosclerotic vascular plaque.  Celiac: Widely patent. Conventional hepatic arterial anatomy. No aneurysmal dilatation.  SMA: Widely patent.  Renals: 2 left and single right renal arteries are patent and unremarkable. No changes of fibromuscular dysplasia or aneurysmal dilatation.  IMA: Widely patent and unremarkable.  Inflow: Widely patent and unremarkable  Proximal Outflow: Widely patent and unremarkable  Veins: No focal venous abnormality.  Review of the MIP images confirms the above findings.  NON-VASCULAR  Abdomen: Unremarkable CT appearance of the stomach, duodenum, spleen, adrenal glands and pancreas. Normal hepatic contours and morphology. No discrete hepatic lesion. A single stone is present within the gallbladder neck. No secondary signs to suggest acute cholecystitis. No intra or extrahepatic biliary ductal dilatation.  Unremarkable appearance of the bilateral kidneys. No focal solid lesion, hydronephrosis or nephrolithiasis. 2.1 cm exophytic simple cyst from the lower pole of the left kidney No evidence of obstruction or focal bowel wall thickening. Normal appendix in the right lower quadrant. The terminal ileum is unremarkable. No free fluid or suspicious adenopathy.  Pelvis: Prostatomegaly. The prostate gland measures 5.4 cm transversely. The bladder is markedly distended with urine. Unremarkable seminal vesicles. Small bilateral fat containing inguinal hernias.  Bones/Soft Tissues: No acute fracture or aggressive appearing lytic or blastic osseous lesion. Circumscribed 9 mm sclerotic focus in the right iliac bone just above the acetabulum has a narrow zone of transition and is favored to represent a benign bone island. Mild  L4-L5, and L5-S1 degenerative disc disease.  IMPRESSION: VASCULAR:  1. Negative for acute aortic pathology. 2. Mild ectasia of the ascending thoracic aorta to a maximal diameter of 4.1 cm. 3. Mild ectasia of the origin of the left subclavian artery to 1.6 cm. NON VASCULAR  1. No  acute abnormality. 2. Nonspecific 6 mm right middle lobe nodular opacity is favored to reflect a small focus of scarring. If the patient is at high risk for bronchogenic carcinoma, follow-up chest CT at 6-12 months is recommended. If the patient is at low risk for bronchogenic carcinoma, follow-up chest CT at 12 months is recommended. This recommendation follows the consensus statement: Guidelines for Management of Small Pulmonary Nodules Detected on CT Scans: A Statement from the [place] as published in Radiology 2005;237:395-400. 3. Mild by apical paraseptal emphysema and pleural parenchymal scarring. 4. Cholelithiasis without evidence of acute cholecystitis. 5. The bladder is markedly distended with urine. 6. Small bilateral fat containing inguinal hernias and left Bochdalek's hernia. 7. Circumscribed 9 mm sclerotic focus in the right iliac bone just superior to the acetabulum is favored to represent a benign bone island or other fibro-osseous lesion. However, if the patient has a known history of prostate cancer, a blastic metastasis would be difficult to exclude entirely. 8. Small hiatal hernia Signed,  Criselda Peaches, MD  [facility]  [facility]   Electronically Signed   By: Jacqulynn Cadet M.D.   On: 05/21/2014 12:04     Review of Systems  Constitutional: Negative for fever, chills, diaphoresis, appetite change and fatigue.  HENT: Negative for mouth sores, sore throat and trouble swallowing.  Eyes: Negative for visual disturbance.  Respiratory: Negative for cough, chest tightness, shortness of breath and wheezing.  Cardiovascular: Negative for chest pain.  Gastrointestinal: Positive for nausea. Negative for vomiting, abdominal pain, diarrhea and abdominal distention.  Endocrine: Negative for polydipsia, polyphagia and polyuria.  Genitourinary: Negative for dysuria, frequency and hematuria.  Musculoskeletal: Positive for back pain.  Negative for gait problem.  Skin: Negative for color change, pallor and rash.  Neurological: Negative for dizziness, syncope, light-headedness and headaches.  Hematological: Does not bruise/bleed easily.  Psychiatric/Behavioral: Negative for behavioral problems and confusion.    Blood pressure 123/83, pulse 65, temperature 97.7 F (36.5 C), temperature source Oral, resp. rate 11, SpO2 97.00%.  Physical Exam  Constitutional: He is oriented to person, place, and time. He appears well-developed and well-nourished. No distress.  HENT: Head: Normocephalic. Eyes: Conjunctivae are normal. Pupils are equal, round, and reactive to light. No scleral icterus.  Neck: Normal range of motion. Neck supple. No thyromegaly present. No JVD or bruits  Cardiovascular: Normal rate, regular rhythm, S1 normal and S2 normal. Exam reveals no gallop and no friction rub. No murmur heard. No aortic insufficiency murmur.  Pulmonary/Chest: Effort normal and breath sounds normal.  Abdominal: Soft. Bowel sounds are increased. There is no tenderness. There is no rebound.  Musculoskeletal: Normal range of motion.  Neurological: He is alert and oriented to person, place, and time.  Skin: Skin is warm and dry. No rash noted.  Psychiatric: He has a normal mood and affect. His behavior is normal.  Assessment/Plan Acute coronary syndrome Tobacco use disorder GERD Hyperlipidemia H/O colon polyps  Admit R/O MI  Birdie Riddle 05/21/2014, 2:02 PM

## 2014-05-21 NOTE — ED Notes (Signed)
Pt reports onset this am of back in mid back, then began radiating around to under both arms. Having nausea, no sob, denies cough. ekg done at triage.

## 2014-05-21 NOTE — Progress Notes (Signed)
ANTICOAGULATION CONSULT NOTE - Follow Up Consult  Pharmacy Consult for heparin  Indication: chest pain/ACS  No Known Allergies  Patient Measurements: Height: 5\' 10"  (177.8 cm) Weight: 174 lb (78.926 kg) IBW/kg (Calculated) : 73 Heparin Dosing Weight:   Vital Signs: Temp: 97.9 F (36.6 C) (05/23 1930) Temp src: Oral (05/23 1930) BP: 150/94 mmHg (05/23 1930) Pulse Rate: 64 (05/23 1930)  Labs:  Recent Labs  05/21/14 0931 05/21/14 0936 05/21/14 1535 05/21/14 2137  HGB  --  15.0  --   --   HCT  --  44.8  --   --   PLT  --  284  --   --   HEPARINUNFRC  --   --   --  0.53  CREATININE  --  1.33  --   --   TROPONINI <0.30  --  <0.30 <0.30    Estimated Creatinine Clearance: 56.4 ml/min (by C-G formula based on Cr of 1.33).   Medications:  Heparin infusing at 950/hr   Assessment: Heparin is thereapeutic at 0.53 IU/ml. No bleeding reported.  Goal of Therapy:  Heparin level 0.3-0.7 units/ml Monitor platelets by anticoagulation protocol: Yes   Plan:  Continue at 950/hr and f/u daily labs  Curlene Dolphin 05/21/2014,11:06 PM

## 2014-05-21 NOTE — ED Provider Notes (Signed)
CSN: [id number]     Arrival date & time 05/21/14  0900 History   First MD Initiated Contact with Patient 05/21/14 [phone]     Chief Complaint  Patient presents with  . Back Pain  . Nausea      HPI  Patient presents with pain between his shoulder blades. He states that he was up and around about 8:00 this morning. He was sitting on a porch. He developed pain in the midline back. Midline, in the thoracic region, between the shoulder blades. It began to radiate to his bilateral axilla. None to the center anterior chest. No radiation to the neck or jaw. No upper extremity pain radiation. He was not short of breath. Was nauseated. His nausea is improved. His pain has not. No past similar episodes. He takes dsxilant for reflux. It in no way feels like his reflux. No recent exertional pain. No falls injuries or trauma. No recent food intolerance. Nonsmoker, no alcohol use.   Past Medical History  Diagnosis Date  . GERD (gastroesophageal reflux disease)   . Hyperlipidemia   . Colon polyps    Past Surgical History  Procedure Laterality Date  . Orif shoulder fracture Right 1965  . Stapedes surgery Right 1986  . Tonsillectomy  1954   Family History  Problem Relation Age of Onset  . Colon cancer Father 48  . Esophageal cancer Father   . Hypertension Mother   . Stroke Mother   . Cancer Maternal Grandfather     mets   History  Substance Use Topics  . Smoking status: Former Smoker    Types: Pipe    Start date: 12/30/1978  . Smokeless tobacco: Never Used  . Alcohol Use: 8.4 oz/week    14 Cans of beer per week     Comment: 1-2 per day    Review of Systems  Constitutional: Negative for fever, chills, diaphoresis, appetite change and fatigue.  HENT: Negative for mouth sores, sore throat and trouble swallowing.   Eyes: Negative for visual disturbance.  Respiratory: Negative for cough, chest tightness, shortness of breath and wheezing.   Cardiovascular: Negative for chest pain.   Gastrointestinal: Positive for nausea. Negative for vomiting, abdominal pain, diarrhea and abdominal distention.  Endocrine: Negative for polydipsia, polyphagia and polyuria.  Genitourinary: Negative for dysuria, frequency and hematuria.  Musculoskeletal: Positive for back pain. Negative for gait problem.  Skin: Negative for color change, pallor and rash.  Neurological: Negative for dizziness, syncope, light-headedness and headaches.  Hematological: Does not bruise/bleed easily.  Psychiatric/Behavioral: Negative for behavioral problems and confusion.      Allergies  Review of patient's allergies indicates no known allergies.  Home Medications   Prior to Admission medications   Medication Sig Start Date End Date Taking? Authorizing Provider  aspirin 325 MG tablet Take 650 mg by mouth once.   Yes Historical Provider, MD  dexlansoprazole (DEXILANT) 60 MG capsule Take 1 capsule (60 mg total) by mouth daily. 09/27/13  Yes Inda Castle, MD  fish oil-omega-3 fatty acids 1000 MG capsule Take 1 g by mouth daily.   Yes Historical Provider, MD  Multiple Vitamin (MULTIVITAMIN) tablet Take 1 tablet by mouth daily.   Yes Historical Provider, MD   BP 123/83  Pulse 65  Temp(Src) 97.7 F (36.5 C) (Oral)  Resp 11  SpO2 97% Physical Exam  Constitutional: He is oriented to person, place, and time. He appears well-developed and well-nourished. No distress.  HENT:  Head: Normocephalic.  Eyes: Conjunctivae are normal. Pupils  are equal, round, and reactive to light. No scleral icterus.  Neck: Normal range of motion. Neck supple. No thyromegaly present.  No JVD or bruits  Cardiovascular: Normal rate, regular rhythm, S1 normal and S2 normal.  Exam reveals no gallop and no friction rub.   No murmur heard. No aortic insufficiency murmur.  Pulmonary/Chest: Effort normal and breath sounds normal. No respiratory distress. He has no wheezes. He has no rales.  Abdominal: Soft. Bowel sounds are normal.  Distention: patch abdomen is soft. No reproducible tenderness. There is no tenderness. There is no rebound.  Musculoskeletal: Normal range of motion.  Neurological: He is alert and oriented to person, place, and time.  Skin: Skin is warm and dry. No rash noted.  Normal perfusion and bilateral extremities. No pulse deficits. No weakness in the arms or legs. He describes no weakness or disuse, even temporary.  Psychiatric: He has a normal mood and affect. His behavior is normal.    ED Course  Procedures (including critical care time) Labs Review Labs Reviewed  COMPREHENSIVE METABOLIC PANEL - Abnormal; Notable for the following:    Glucose, Bld 133 (*)    GFR calc non Af Amer 54 (*)    GFR calc Af Amer 63 (*)    All other components within normal limits  CBC WITH DIFFERENTIAL  TROPONIN I  LIPASE, BLOOD  I-STAT TROPOININ, ED    Imaging Review Ct Angio Chest Aorta W/cm &/or Wo/cm  05/21/2014   CLINICAL DATA:  Upper back pain extending to the chest  EXAM: CT ANGIOGRAPHY CHEST, ABDOMEN AND PELVIS  TECHNIQUE: Multidetector CT imaging through the chest, abdomen and pelvis was performed using the standard protocol during bolus administration of intravenous contrast. Multiplanar reconstructed images and MIPs were obtained and reviewed to evaluate the vascular anatomy.  CONTRAST:  118mL OMNIPAQUE IOHEXOL 350 MG/ML SOLN  COMPARISON:  None.  FINDINGS: CTA CHEST FINDINGS  Mediastinum: Unremarkable CT appearance of the thyroid gland. No suspicious mediastinal or hilar adenopathy. No soft tissue mediastinal mass. Small hiatal hernia.  Heart/Vascular: No evidence of acute intramural hematoma non contrasted images. Conventional 3 vessel aortic arch. There is a ectasia of the origins of the great vessels. Specifically, the origin of the left subclavian artery is 1.6 cm in diameter. The ascending thoracic aorta is also ectatic with a maximal diameter of 4.1 cm just proximal to the transverse segment. The aortic  root is within normal limits. No effacement of the sino-tubular junction. Cardiac structures are within normal limits for size. No pericardial effusion. Mild atherosclerotic calcification present in left anterior descending coronary artery. No large central PE. Main and central pulmonary arteries are within normal limits for size.  Lungs/Pleura: Small left Bochdalek's hernia containing retroperitoneal fat. Biapical pleural parenchymal scarring with emphysematous change. Mild paraseptal emphysema confined to the upper lobes. 6 mm nodular opacity in the right middle lobe (image 76 series 6) dependent atelectasis noted in the lower lobes bilaterally. Respiratory motion slightly limits evaluation for nodules in the bases.  Bones/Soft Tissues: No acute fracture or aggressive appearing lytic or blastic osseous lesion.  Review of the MIP images confirms the above findings.  CTA ABDOMEN AND PELVIS FINDINGS  VASCULAR  Aorta: Normal caliber aorta without aneurysmal dilatation, dissection or significant atherosclerotic vascular plaque.  Celiac: Widely patent. Conventional hepatic arterial anatomy. No aneurysmal dilatation.  SMA: Widely patent.  Renals: 2 left and single right renal arteries are patent and unremarkable. No changes of fibromuscular dysplasia or aneurysmal dilatation.  IMA: Widely patent  and unremarkable.  Inflow: Widely patent and unremarkable  Proximal Outflow: Widely patent and unremarkable  Veins: No focal venous abnormality.  Review of the MIP images confirms the above findings.  NON-VASCULAR  Abdomen: Unremarkable CT appearance of the stomach, duodenum, spleen, adrenal glands and pancreas. Normal hepatic contours and morphology. No discrete hepatic lesion. A single stone is present within the gallbladder neck. No secondary signs to suggest acute cholecystitis. No intra or extrahepatic biliary ductal dilatation.  Unremarkable appearance of the bilateral kidneys. No focal solid lesion, hydronephrosis or  nephrolithiasis. 2.1 cm exophytic simple cyst from the lower pole of the left kidney No evidence of obstruction or focal bowel wall thickening. Normal appendix in the right lower quadrant. The terminal ileum is unremarkable. No free fluid or suspicious adenopathy.  Pelvis: Prostatomegaly. The prostate gland measures 5.4 cm transversely. The bladder is markedly distended with urine. Unremarkable seminal vesicles. Small bilateral fat containing inguinal hernias.  Bones/Soft Tissues: No acute fracture or aggressive appearing lytic or blastic osseous lesion. Circumscribed 9 mm sclerotic focus in the right iliac bone just above the acetabulum has a narrow zone of transition and is favored to represent a benign bone island. Mild L4-L5, and L5-S1 degenerative disc disease.  IMPRESSION: VASCULAR:  1. Negative for acute aortic pathology. 2. Mild ectasia of the ascending thoracic aorta to a maximal diameter of 4.1 cm. 3. Mild ectasia of the origin of the left subclavian artery to 1.6 cm. NON VASCULAR  1. No acute abnormality. 2. Nonspecific 6 mm right middle lobe nodular opacity is favored to reflect a small focus of scarring. If the patient is at high risk for bronchogenic carcinoma, follow-up chest CT at 6-12 months is recommended. If the patient is at low risk for bronchogenic carcinoma, follow-up chest CT at 12 months is recommended. This recommendation follows the consensus statement: Guidelines for Management of Small Pulmonary Nodules Detected on CT Scans: A Statement from the [place] as published in Radiology 2005;237:395-400. 3. Mild by apical paraseptal emphysema and pleural parenchymal scarring. 4. Cholelithiasis without evidence of acute cholecystitis. 5. The bladder is markedly distended with urine. 6. Small bilateral fat containing inguinal hernias and left Bochdalek's hernia. 7. Circumscribed 9 mm sclerotic focus in the right iliac bone just superior to the acetabulum is favored to represent a benign  bone island or other fibro-osseous lesion. However, if the patient has a known history of prostate cancer, a blastic metastasis would be difficult to exclude entirely. 8. Small hiatal hernia Signed,  Criselda Peaches, MD  [facility]  [facility]   Electronically Signed   By: Jacqulynn Cadet M.D.   On: 05/21/2014 12:04   Ct Angio Abd/pel W/ And/or W/o  05/21/2014   CLINICAL DATA:  Upper back pain extending to the chest  EXAM: CT ANGIOGRAPHY CHEST, ABDOMEN AND PELVIS  TECHNIQUE: Multidetector CT imaging through the chest, abdomen and pelvis was performed using the standard protocol during bolus administration of intravenous contrast. Multiplanar reconstructed images and MIPs were obtained and reviewed to evaluate the vascular anatomy.  CONTRAST:  11mL OMNIPAQUE IOHEXOL 350 MG/ML SOLN  COMPARISON:  None.  FINDINGS: CTA CHEST FINDINGS  Mediastinum: Unremarkable CT appearance of the thyroid gland. No suspicious mediastinal or hilar adenopathy. No soft tissue mediastinal mass. Small hiatal hernia.  Heart/Vascular: No evidence of acute intramural hematoma non contrasted images. Conventional 3 vessel aortic arch. There is a ectasia of the origins of the great vessels. Specifically, the origin of the left subclavian  artery is 1.6 cm in diameter. The ascending thoracic aorta is also ectatic with a maximal diameter of 4.1 cm just proximal to the transverse segment. The aortic root is within normal limits. No effacement of the sino-tubular junction. Cardiac structures are within normal limits for size. No pericardial effusion. Mild atherosclerotic calcification present in left anterior descending coronary artery. No large central PE. Main and central pulmonary arteries are within normal limits for size.  Lungs/Pleura: Small left Bochdalek's hernia containing retroperitoneal fat. Biapical pleural parenchymal scarring with emphysematous change. Mild paraseptal emphysema  confined to the upper lobes. 6 mm nodular opacity in the right middle lobe (image 76 series 6) dependent atelectasis noted in the lower lobes bilaterally. Respiratory motion slightly limits evaluation for nodules in the bases.  Bones/Soft Tissues: No acute fracture or aggressive appearing lytic or blastic osseous lesion.  Review of the MIP images confirms the above findings.  CTA ABDOMEN AND PELVIS FINDINGS  VASCULAR  Aorta: Normal caliber aorta without aneurysmal dilatation, dissection or significant atherosclerotic vascular plaque.  Celiac: Widely patent. Conventional hepatic arterial anatomy. No aneurysmal dilatation.  SMA: Widely patent.  Renals: 2 left and single right renal arteries are patent and unremarkable. No changes of fibromuscular dysplasia or aneurysmal dilatation.  IMA: Widely patent and unremarkable.  Inflow: Widely patent and unremarkable  Proximal Outflow: Widely patent and unremarkable  Veins: No focal venous abnormality.  Review of the MIP images confirms the above findings.  NON-VASCULAR  Abdomen: Unremarkable CT appearance of the stomach, duodenum, spleen, adrenal glands and pancreas. Normal hepatic contours and morphology. No discrete hepatic lesion. A single stone is present within the gallbladder neck. No secondary signs to suggest acute cholecystitis. No intra or extrahepatic biliary ductal dilatation.  Unremarkable appearance of the bilateral kidneys. No focal solid lesion, hydronephrosis or nephrolithiasis. 2.1 cm exophytic simple cyst from the lower pole of the left kidney No evidence of obstruction or focal bowel wall thickening. Normal appendix in the right lower quadrant. The terminal ileum is unremarkable. No free fluid or suspicious adenopathy.  Pelvis: Prostatomegaly. The prostate gland measures 5.4 cm transversely. The bladder is markedly distended with urine. Unremarkable seminal vesicles. Small bilateral fat containing inguinal hernias.  Bones/Soft Tissues: No acute fracture or  aggressive appearing lytic or blastic osseous lesion. Circumscribed 9 mm sclerotic focus in the right iliac bone just above the acetabulum has a narrow zone of transition and is favored to represent a benign bone island. Mild L4-L5, and L5-S1 degenerative disc disease.  IMPRESSION: VASCULAR:  1. Negative for acute aortic pathology. 2. Mild ectasia of the ascending thoracic aorta to a maximal diameter of 4.1 cm. 3. Mild ectasia of the origin of the left subclavian artery to 1.6 cm. NON VASCULAR  1. No acute abnormality. 2. Nonspecific 6 mm right middle lobe nodular opacity is favored to reflect a small focus of scarring. If the patient is at high risk for bronchogenic carcinoma, follow-up chest CT at 6-12 months is recommended. If the patient is at low risk for bronchogenic carcinoma, follow-up chest CT at 12 months is recommended. This recommendation follows the consensus statement: Guidelines for Management of Small Pulmonary Nodules Detected on CT Scans: A Statement from the [place] as published in Radiology 2005;237:395-400. 3. Mild by apical paraseptal emphysema and pleural parenchymal scarring. 4. Cholelithiasis without evidence of acute cholecystitis. 5. The bladder is markedly distended with urine. 6. Small bilateral fat containing inguinal hernias and left Bochdalek's hernia. 7. Circumscribed 9 mm sclerotic focus in the right  iliac bone just superior to the acetabulum is favored to represent a benign bone island or other fibro-osseous lesion. However, if the patient has a known history of prostate cancer, a blastic metastasis would be difficult to exclude entirely. 8. Small hiatal hernia Signed,  Criselda Peaches, MD  [facility]  [facility]   Electronically Signed   By: Jacqulynn Cadet M.D.   On: 05/21/2014 12:04     EKG Interpretation   Date/Time:  Saturday May 21 2014 09:06:26 EDT Ventricular Rate:  65 PR Interval:  202 QRS Duration:  96 QT Interval:  386 QTC Calculation: 401 R Axis:   81 Text Interpretation:  Normal sinus rhythm Slow R progression No comparison  Confirmed by Jeneen Rinks  MD, [place] ([id number]) on 05/21/2014 9:13:37 AM      MDM   Final diagnoses:  Chest pain    Patient has slow R wave progression on his EKG. No ST changes. No Q waves. Pain is not classic for angina. Scapular pain dissection is a concern. CT is been ordered. He has no risk for PE. He is not tachycardic. Is to be GI in nature. However, because it is less likely with him being on Dexilant. No food intolerance to suggest biliary. No alcohol use to suggest pancreatic.  Normal First troponin, Lipase, hepatobiliary enzymes. CT Angio shows no dissection.  Pt HEART score is 4-5 (+ concerning history, abnormal EKG, Age 68, 2 Risks (hypercholesterolemia, + FM with father).   11:50:  Patient still resting comfortably. Pain relieved with one dose of morphine. Currently asymptomatic. He'll discuss with on-call cardiology, Dr. Terrence Dupont.   Tanna Furry, MD 05/21/14 1343

## 2014-05-22 LAB — TROPONIN I: Troponin I: <0.3 ng/mL (ref <0.30)

## 2014-05-22 LAB — HEPARIN LEVEL (UNFRACTIONATED): Heparin Unfractionated: 0.5 IU/mL (ref 0.30–0.70)

## 2014-05-22 MED ORDER — SODIUM CHLORIDE 0.9 % IV SOLN: INTRAVENOUS | Status: DC

## 2014-05-22 NOTE — Progress Notes (Signed)
ANTICOAGULATION CONSULT NOTE - Initial Consult  Pharmacy Consult for Heparin Indication: chest pain/ACS  No Known Allergies  Patient Measurements: Height: 5\' 10"  (177.8 cm) Weight: 169 lb 5 oz (76.8 kg) IBW/kg (Calculated) : 73  Vital Signs: Temp: 97.4 F (36.3 C) (05/24 0814) Temp src: Oral (05/24 0814) BP: 122/83 mmHg (05/24 0814) Pulse Rate: 57 (05/24 0814)  Labs:  Recent Labs  05/21/14 0936 05/21/14 1535 05/21/14 2137 05/22/14 0259  HGB 15.0  --   --   --   HCT 44.8  --   --   --   PLT 284  --   --   --   HEPARINUNFRC  --   --  0.53 0.50  CREATININE 1.33  --   --   --   TROPONINI  --  <0.30 <0.30 <0.30    Estimated Creatinine Clearance: 56.4 ml/min (by C-G formula based on Cr of 1.33).   Medical History: Past Medical History  Diagnosis Date  . GERD (gastroesophageal reflux disease)   . Hyperlipidemia   . Colon polyps     Medications:  Scheduled:  . aspirin  324 mg Oral NOW   Or  . aspirin  300 mg Rectal NOW  . aspirin EC  81 mg Oral Daily  . atorvastatin  40 mg Oral q1800  . dexlansoprazole  60 mg Oral Daily  . metoprolol tartrate  25 mg Oral BID  . multivitamin with minerals  1 tablet Oral Daily   Infusions:  . sodium chloride 10 mL/hr at 05/22/14 0600  . heparin 950 Units/hr (05/22/14 0600)    Assessment: 68 yo M presenting on 5/23 with pain between shoulder blades to start heparin drip for r/o ACS. CBC wnl. HL on repeat remains therapeutic at 0.5. Troponins remain negative.   Goal of Therapy:  Heparin level 0.3-0.7 units/ml Monitor platelets by anticoagulation protocol: Yes   Plan:  - Cont heparin at 950 u/hr - Daily HL, CBC, s/s bleeding - F/u need to continue with continued negative cardiac enzymes  Harolyn Rutherford, PharmD Clinical Pharmacist - Resident Pager: [phone] Pharmacy: [phone] 05/22/2014 9:38 AM

## 2014-05-22 NOTE — Progress Notes (Signed)
Ref: Woody Seller, MD   Subjective:  Feeling better. Afebrile.  Objective:  Vital Signs in the last 24 hours: Temp:  [97.2 F (36.2 C)-97.9 F (36.6 C)] 97.4 F (36.3 C) (05/24 1223) Pulse Rate:  [55-64] 55 (05/24 1223) Cardiac Rhythm:  [-] Normal sinus rhythm (05/24 0824) Resp:  [12] 12 (05/23 1634) BP: (122-150)/(69-94) 125/85 mmHg (05/24 1223) SpO2:  [98 %-100 %] 99 % (05/24 1223) Weight:  [76.8 kg (169 lb 5 oz)] 76.8 kg (169 lb 5 oz) (05/24 0500)  Physical Exam: BP Readings from Last 1 Encounters:  05/22/14 125/85    Wt Readings from Last 1 Encounters:  05/22/14 76.8 kg (169 lb 5 oz)    Weight change:   HEENT: [place]/AT, Eyes-PERL, EOMI, Conjunctiva-Pink, Sclera-Non-icteric Neck: No JVD, No bruit, Trachea midline. Lungs:  Clear, Bilateral. Cardiac:  Regular rhythm, normal S1 and S2, no S3.  Abdomen:  Soft, non-tender. Extremities:  No edema present. No cyanosis. No clubbing. CNS: AxOx3, Cranial nerves grossly intact, moves all 4 extremities. Right handed. Skin: Warm and dry.   Intake/Output from previous day: 05/23 0701 - 05/24 0700 In: 274.1 [P.O.:240; I.V.:34.1] Out: 1300 [Urine:1300]    Lab Results: BMET    Component Value Date/Time   NA 139 05/21/2014 0936   NA 140 06/22/2009 0820   NA 136 05/20/2008 0821   K 4.6 05/21/2014 0936   K 4.2 06/22/2009 0820   K 4.9 05/20/2008 0821   CL 101 05/21/2014 0936   CL 105 06/22/2009 0820   CL 106 05/20/2008 0821   CO2 21 05/21/2014 0936   CO2 29 06/22/2009 0820   CO2 28 05/20/2008 0821   GLUCOSE 133* 05/21/2014 0936   GLUCOSE 85 06/22/2009 0820   GLUCOSE 81 05/20/2008 0821   BUN 18 05/21/2014 0936   BUN 15 06/22/2009 0820   BUN 13 05/20/2008 0821   CREATININE 1.33 05/21/2014 0936   CREATININE 1.2 06/22/2009 0820   CREATININE 1.3 05/20/2008 0821   CALCIUM 9.6 05/21/2014 0936   CALCIUM 8.9 06/22/2009 0820   CALCIUM 9.5 05/20/2008 0821   GFRNONAA 54* 05/21/2014 0936   GFRNONAA 65.34 06/22/2009 0820   GFRNONAA 60 05/20/2008 0821    GFRAA 63* 05/21/2014 0936   GFRAA 72 05/20/2008 0821   GFRAA 80 05/21/2007 0937   CBC    Component Value Date/Time   WBC 8.2 05/21/2014 0936   RBC 5.11 05/21/2014 0936   HGB 15.0 05/21/2014 0936   HCT 44.8 05/21/2014 0936   PLT 284 05/21/2014 0936   MCV 87.7 05/21/2014 0936   MCH 29.4 05/21/2014 0936   MCHC 33.5 05/21/2014 0936   RDW 12.8 05/21/2014 0936   LYMPHSABS 1.8 05/21/2014 0936   MONOABS 0.5 05/21/2014 0936   EOSABS 0.0 05/21/2014 0936   BASOSABS 0.0 05/21/2014 0936   HEPATIC Function Panel  Recent Labs  05/21/14 0936  PROT 7.4   HEMOGLOBIN A1C No components found with this basename: HGA1C,  MPG   CARDIAC ENZYMES Lab Results  Component Value Date   TROPONINI <0.30 05/22/2014   TROPONINI <0.30 05/21/2014   TROPONINI <0.30 05/21/2014   BNP No results found for this basename: PROBNP,  in the last 8760 hours TSH No results found for this basename: TSH,  in the last 8760 hours CHOLESTEROL No results found for this basename: CHOL,  in the last 8760 hours  Scheduled Meds: . aspirin EC  81 mg Oral Daily  . atorvastatin  40 mg Oral q1800  . dexlansoprazole  60  mg Oral Daily  . metoprolol tartrate  25 mg Oral BID  . multivitamin with minerals  1 tablet Oral Daily   Continuous Infusions: . sodium chloride 10 mL/hr at 05/22/14 0600  . [START ON 05/24/2014] sodium chloride    . heparin 950 Units/hr (05/22/14 0600)   PRN Meds:.acetaminophen, morphine injection, nitroGLYCERIN, ondansetron (ZOFRAN) IV  Assessment/Plan: Acute coronary syndrome  Tobacco use disorder  GERD  Hyperlipidemia  H/O colon polyps  Awaiting cardiac cath.    LOS: 1 day    Dixie Dials  MD  05/22/2014, 3:12 PM

## 2014-05-23 LAB — BASIC METABOLIC PANEL
BUN: 20 mg/dL (ref 6–23)
CHLORIDE: 106 meq/L (ref 96–112)
CO2: 25 mEq/L (ref 19–32)
CREATININE: 1.43 mg/dL — AB (ref 0.50–1.35)
Calcium: 8.7 mg/dL (ref 8.4–10.5)
GFR calc non Af Amer: 50 mL/min — ABNORMAL LOW (ref >90)
GFR, EST AFRICAN AMERICAN: 57 mL/min — AB (ref >90)
Glucose, Bld: 109 mg/dL — ABNORMAL HIGH (ref 70–99)
POTASSIUM: 4.3 meq/L (ref 3.7–5.3)
Sodium: 139 mEq/L (ref 137–147)

## 2014-05-23 LAB — CBC
HEMATOCRIT: 41.7 % (ref 39.0–52.0)
HEMOGLOBIN: 13.4 g/dL (ref 13.0–17.0)
MCH: 28.7 pg (ref 26.0–34.0)
MCHC: 32.1 g/dL (ref 30.0–36.0)
MCV: 89.3 fL (ref 78.0–100.0)
Platelets: 240 10*3/uL (ref 150–400)
RBC: 4.67 MIL/uL (ref 4.22–5.81)
RDW: 13.3 % (ref 11.5–15.5)
WBC: 7.4 10*3/uL (ref 4.0–10.5)

## 2014-05-23 LAB — LIPID PANEL
Cholesterol: 179 mg/dL (ref 0–200)
HDL: 53 mg/dL (ref >39)
LDL CALC: 108 mg/dL — AB (ref 0–99)
Total CHOL/HDL Ratio: 3.4 RATIO
Triglycerides: 89 mg/dL (ref <150)
VLDL: 18 mg/dL (ref 0–40)

## 2014-05-23 LAB — HEPARIN LEVEL (UNFRACTIONATED): Heparin Unfractionated: 0.32 IU/mL (ref 0.30–0.70)

## 2014-05-23 MED ORDER — SODIUM CHLORIDE 0.9 % IJ SOLN: 3.0000 mL | INTRAMUSCULAR | Status: DC | PRN

## 2014-05-23 MED ORDER — SODIUM CHLORIDE 0.9 % IV SOLN
250.0000 mL | INTRAVENOUS | Status: DC | PRN
Start: 2014-05-23 — End: 2014-05-24

## 2014-05-23 MED ORDER — SODIUM CHLORIDE 0.9 % IJ SOLN
3.0000 mL | Freq: Two times a day (BID) | INTRAMUSCULAR | Status: DC
  Administered 2014-05-23 – 2014-05-24 (×2): 3 mL via INTRAVENOUS

## 2014-05-23 NOTE — Progress Notes (Signed)
Spoke with pt regarding cardiac cath scheduled for am, has no questions re procedure, states that MD spoke with him regarding risks and benefits.  Aware that they may use wrist or groin and aware of possible bedrest, NPO after MN, understands regarding this.  Offered to answer any ?s that he may have up until procedure, aware and states none at present, Hazle Nordmann RN

## 2014-05-23 NOTE — Progress Notes (Signed)
Ref: Woody Seller, MD   Subjective:  No chest pain. Mild renal dysfunction. Borderline per patient in past.  Objective:  Vital Signs in the last 24 hours: Temp:  [97.3 F (36.3 C)-98.2 F (36.8 C)] 97.3 F (36.3 C) (05/25 1148) Pulse Rate:  [51-68] 52 (05/25 1148) Cardiac Rhythm:  [-] Sinus bradycardia (05/25 0826) Resp:  [15-20] 20 (05/25 1148) BP: (115-152)/(65-90) 152/90 mmHg (05/25 1148) SpO2:  [96 %-99 %] 99 % (05/25 1148) Weight:  [77.1 kg (169 lb 15.6 oz)] 77.1 kg (169 lb 15.6 oz) (05/25 0923)  Physical Exam: BP Readings from Last 1 Encounters:  05/23/14 152/90    Wt Readings from Last 1 Encounters:  05/23/14 77.1 kg (169 lb 15.6 oz)    Weight change: -1.826 kg (-4 lb 0.4 oz)  HEENT: [place]/AT, Eyes- PERL, EOMI, Conjunctiva-Pink, Sclera-Non-icteric Neck: No JVD, No bruit, Trachea midline. Lungs:  Clear, Bilateral. Cardiac:  Regular rhythm, normal S1 and S2, no S3.  Abdomen:  Soft, non-tender. Extremities:  No edema present. No cyanosis. No clubbing. CNS: AxOx3, Cranial nerves grossly intact, moves all 4 extremities. Right handed. Skin: Warm and dry.   Intake/Output from previous day: 05/24 0701 - 05/25 0700 In: 958 [P.O.:480; I.V.:478] Out: 1325 [Urine:1325]    Lab Results: BMET    Component Value Date/Time   NA 139 05/23/2014 0250   NA 139 05/21/2014 0936   NA 140 06/22/2009 0820   K 4.3 05/23/2014 0250   K 4.6 05/21/2014 0936   K 4.2 06/22/2009 0820   CL 106 05/23/2014 0250   CL 101 05/21/2014 0936   CL 105 06/22/2009 0820   CO2 25 05/23/2014 0250   CO2 21 05/21/2014 0936   CO2 29 06/22/2009 0820   GLUCOSE 109* 05/23/2014 0250   GLUCOSE 133* 05/21/2014 0936   GLUCOSE 85 06/22/2009 0820   BUN 20 05/23/2014 0250   BUN 18 05/21/2014 0936   BUN 15 06/22/2009 0820   CREATININE 1.43* 05/23/2014 0250   CREATININE 1.33 05/21/2014 0936   CREATININE 1.2 06/22/2009 0820   CALCIUM 8.7 05/23/2014 0250   CALCIUM 9.6 05/21/2014 0936   CALCIUM 8.9 06/22/2009 0820   GFRNONAA  50* 05/23/2014 0250   GFRNONAA 54* 05/21/2014 0936   GFRNONAA 65.34 06/22/2009 0820   GFRAA 57* 05/23/2014 0250   GFRAA 63* 05/21/2014 0936   GFRAA 72 05/20/2008 0821   CBC    Component Value Date/Time   WBC 7.4 05/23/2014 0250   RBC 4.67 05/23/2014 0250   HGB 13.4 05/23/2014 0250   HCT 41.7 05/23/2014 0250   PLT 240 05/23/2014 0250   MCV 89.3 05/23/2014 0250   MCH 28.7 05/23/2014 0250   MCHC 32.1 05/23/2014 0250   RDW 13.3 05/23/2014 0250   LYMPHSABS 1.8 05/21/2014 0936   MONOABS 0.5 05/21/2014 0936   EOSABS 0.0 05/21/2014 0936   BASOSABS 0.0 05/21/2014 0936   HEPATIC Function Panel  Recent Labs  05/21/14 0936  PROT 7.4   HEMOGLOBIN A1C No components found with this basename: HGA1C,  MPG   CARDIAC ENZYMES Lab Results  Component Value Date   TROPONINI <0.30 05/22/2014   TROPONINI <0.30 05/21/2014   TROPONINI <0.30 05/21/2014   BNP No results found for this basename: PROBNP,  in the last 8760 hours TSH No results found for this basename: TSH,  in the last 8760 hours CHOLESTEROL  Recent Labs  05/23/14 0250  CHOL 179    Scheduled Meds: . aspirin EC  81 mg Oral Daily  .  atorvastatin  40 mg Oral q1800  . dexlansoprazole  60 mg Oral Daily  . metoprolol tartrate  25 mg Oral BID  . multivitamin with minerals  1 tablet Oral Daily   Continuous Infusions: . sodium chloride 100 mL/hr at 05/23/14 1139  . [START ON 05/24/2014] sodium chloride    . heparin 950 Units/hr (05/22/14 1938)   PRN Meds:.acetaminophen, morphine injection, nitroGLYCERIN, ondansetron (ZOFRAN) IV  Assessment/Plan: Acute coronary syndrome  Tobacco use disorder  GERD  Hyperlipidemia  H/O colon polyps CKD, II  IV hydration. Cardiac cath in AM.     LOS: 2 days    Dixie Dials  MD  05/23/2014, 1:40 PM

## 2014-05-23 NOTE — Progress Notes (Signed)
ANTICOAGULATION CONSULT NOTE - Initial Consult  Pharmacy Consult for Heparin Indication: chest pain/ACS  No Known Allergies  Patient Measurements: Height: 5\' 10"  (177.8 cm) Weight: 169 lb 15.6 oz (77.1 kg) IBW/kg (Calculated) : 73  Vital Signs: Temp: 97.4 F (36.3 C) (05/25 0826) Temp src: Oral (05/25 0826) BP: 115/84 mmHg (05/25 0826) Pulse Rate: 51 (05/25 0826)  Labs:  Recent Labs  05/21/14 0936 05/21/14 1535 05/21/14 2137 05/22/14 0259 05/23/14 0250  HGB 15.0  --   --   --  13.4  HCT 44.8  --   --   --  41.7  PLT 284  --   --   --  240  HEPARINUNFRC  --   --  0.53 0.50 0.32  CREATININE 1.33  --   --   --  1.43*  TROPONINI  --  <0.30 <0.30 <0.30  --     Estimated Creatinine Clearance: 52.5 ml/min (by C-G formula based on Cr of 1.43).   Medical History: Past Medical History  Diagnosis Date  . GERD (gastroesophageal reflux disease)   . Hyperlipidemia   . Colon polyps     Medications:  Scheduled:  . aspirin EC  81 mg Oral Daily  . atorvastatin  40 mg Oral q1800  . dexlansoprazole  60 mg Oral Daily  . metoprolol tartrate  25 mg Oral BID  . multivitamin with minerals  1 tablet Oral Daily   Infusions:  . sodium chloride 10 mL (05/22/14 1938)  . [START ON 05/24/2014] sodium chloride    . heparin 950 Units/hr (05/22/14 1938)    Assessment: 68 yo M presenting on 5/23 with pain between shoulder blades to start heparin drip for r/o ACS. CBC wnl. HL on repeat remains therapeutic at 0.32. Troponins remain negative. Awaiting possible cardiac cath   Goal of Therapy:  Heparin level 0.3-0.7 units/ml Monitor platelets by anticoagulation protocol: Yes   Plan:  - Cont heparin at 950 u/hr - Daily HL, CBC, s/s bleeding - F/u need to continue with continued negative cardiac enzymes, awaiting cardiac cath?  Oval Linsey. Leitha Schuller, PharmD Clinical Pharmacist - Resident Phone: [phone] Pager: [phone] 05/23/2014 8:53 AM

## 2014-05-23 NOTE — Progress Notes (Signed)
[facility]  2D Echocardiogram completed.  [place], RDCS 05/23/2014 10:37 AM

## 2014-05-24 ENCOUNTER — Encounter (HOSPITAL_COMMUNITY)
Admission: EM | Disposition: A | Discharge status: Home / Self Care | Payer: Self-pay | Source: Home / Self Care | Attending: Cardiovascular Disease

## 2014-05-24 HISTORY — PX: LEFT HEART CATHETERIZATION WITH CORONARY/GRAFT ANGIOGRAM: SHX5450

## 2014-05-24 LAB — CBC
HCT: 42.6 % (ref 39.0–52.0)
HEMOGLOBIN: 14.4 g/dL (ref 13.0–17.0)
MCH: 30 pg (ref 26.0–34.0)
MCHC: 33.8 g/dL (ref 30.0–36.0)
MCV: 88.8 fL (ref 78.0–100.0)
PLATELETS: 238 10*3/uL (ref 150–400)
RBC: 4.8 MIL/uL (ref 4.22–5.81)
RDW: 13.2 % (ref 11.5–15.5)
WBC: 9.5 10*3/uL (ref 4.0–10.5)

## 2014-05-24 LAB — BASIC METABOLIC PANEL
BUN: 18 mg/dL (ref 6–23)
CHLORIDE: 107 meq/L (ref 96–112)
CO2: 25 meq/L (ref 19–32)
Calcium: 8.8 mg/dL (ref 8.4–10.5)
Creatinine, Ser: 1.32 mg/dL (ref 0.50–1.35)
GFR calc Af Amer: 63 mL/min — ABNORMAL LOW (ref >90)
GFR calc non Af Amer: 55 mL/min — ABNORMAL LOW (ref >90)
GLUCOSE: 106 mg/dL — AB (ref 70–99)
POTASSIUM: 4.4 meq/L (ref 3.7–5.3)
SODIUM: 141 meq/L (ref 137–147)

## 2014-05-24 LAB — PROTIME-INR
INR: 0.95 (ref 0.00–1.49)
Prothrombin Time: 12.5 seconds (ref 11.6–15.2)

## 2014-05-24 LAB — HEPARIN LEVEL (UNFRACTIONATED): HEPARIN UNFRACTIONATED: 0.36 [IU]/mL (ref 0.30–0.70)

## 2014-05-24 LAB — POCT ACTIVATED CLOTTING TIME: ACTIVATED CLOTTING TIME: 99 s

## 2014-05-24 SURGERY — LEFT HEART CATHETERIZATION WITH CORONARY/GRAFT ANGIOGRAM
Anesthesia: LOCAL

## 2014-05-24 MED ORDER — HEPARIN (PORCINE) IN NACL 2-0.9 UNIT/ML-% IJ SOLN
INTRAMUSCULAR | Status: AC
  Filled 2014-05-24: qty 1000

## 2014-05-24 MED ORDER — MIDAZOLAM HCL 2 MG/2ML IJ SOLN
INTRAMUSCULAR | Status: AC
  Filled 2014-05-24: qty 2

## 2014-05-24 MED ORDER — SODIUM CHLORIDE 0.9 % IV SOLN: INTRAVENOUS | Status: DC

## 2014-05-24 MED ORDER — FENTANYL CITRATE 0.05 MG/ML IJ SOLN
INTRAMUSCULAR | Status: AC
  Filled 2014-05-24: qty 2

## 2014-05-24 MED ORDER — LIDOCAINE HCL (PF) 1 % IJ SOLN
INTRAMUSCULAR | Status: AC
  Filled 2014-05-24: qty 30

## 2014-05-24 NOTE — Discharge Summary (Signed)
Physician Discharge Summary  Patient ID: Ronald Mccormick MRN: [id number] DOB/AGE: 01-May-1946 67 y.o.  Admit date: 05/21/2014 Discharge date: 05/24/2014  Admission Diagnoses: Acute coronary syndrome  Tobacco use disorder  GERD  Hyperlipidemia  H/O colon polyps  CKD, II  Discharge Diagnoses:  Principle Problem: * Chest pain* CAD, multivessel, native vessel H/O Tobacco use disorder  GERD  Hyperlipidemia  H/O colon polyps  CKD, II  Discharged Condition: fair  Hospital Course: 68 year old male presented with pain between his shoulder blades on admission after heavy exertion day before. He underwent cardiac cath which showed minimal coronary artery disease. He was discharged home in stable condition with follow up by primary care in 1-2 weeks.   Consults: cardiology  Significant Diagnostic Studies: labs: Normal CBC, CMET with borderline high glucose level of 109 and LDL cholesterol level of 108 with normal total and HDL cholesterol.  Treatments: cardiac meds: aspirin, atorvastatin, metoprolol and anticoagulation: heparin  Discharge Exam: Blood pressure 151/75, pulse 61, temperature 97.4 F (36.3 C), temperature source Oral, resp. rate 13, height 5\' 10"  (1.778 m), weight 78.8 kg (173 lb 11.6 oz), SpO2 96.00%. HEENT: [place]/AT, Eyes- PERL, EOMI, Conjunctiva-Pink, Sclera-Non-icteric  Neck: No JVD, No bruit, Trachea midline.  Lungs: Clear, Bilateral.  Cardiac: Regular rhythm, normal S1 and S2, no S3.  Abdomen: Soft, non-tender.  Extremities: No edema present. No cyanosis. No clubbing.  CNS: AxOx3, Cranial nerves grossly intact, moves all 4 extremities. Right handed.  Skin: Warm and dry.   Disposition: 01, Home or self care.     Medication List         aspirin 325 MG tablet  Take 650 mg by mouth once.     dexlansoprazole 60 MG capsule  Commonly known as:  DEXILANT  Take 1 capsule (60 mg total) by mouth daily.     fish oil-omega-3 fatty acids 1000 MG capsule  Take 1 g by  mouth daily.     multivitamin tablet  Take 1 tablet by mouth daily.           Follow-up Information   Follow up with Woody Seller, MD. Schedule an appointment as soon as possible for a visit in 2 weeks.   Specialty:  Family Medicine   Contact information:   [address] [place] [address] [phone]       Follow up with [facility], MD. Schedule an appointment as soon as possible for a visit in 2 months. (and as needed)    Specialty:  Cardiology   Contact information:   [place] [address] [phone]       Signed: Birdie Riddle 05/24/2014, 7:01 PM

## 2014-05-24 NOTE — CV Procedure (Signed)
PROCEDURE:  Left heart catheterization with selective coronary angiography, left ventriculogram.  CLINICAL HISTORY:  This is a 68 year old male with typical angina and multiple cardiac risk factors.  The risks, benefits, and details of the procedure were explained to the patient.  The patient verbalized understanding and wanted to proceed.  Informed written consent was obtained.  PROCEDURE TECHNIQUE:  The patient was approached from the right femoral artery using a 5 French short sheath.  Left coronary angiography was done using a Judkins L4 guide catheter.  Right coronary angiography was done using a Judkins R4 guide catheter.  Left ventriculography was done using a pigtail catheter.    CONTRAST:  Total of 30 cc.  COMPLICATIONS:  None.  At the end of the procedure a manual pressure was used for hemostasis.    HEMODYNAMICS:  Aortic pressure was 158/85; LV pressure was 140/2; LVEDP 15.  There was no gradient between the left ventricle and aorta.    ANGIOGRAM/CORONARY ARTERIOGRAM:   The left main coronary artery is short.  The left anterior descending artery is long, wraps around apex of the heart and has proximal luminal irregularities otherwise unremarkable including diagonal branches.  The left circumflex artery has proximal luminal irregularities. Ramus and OM are unremarkable.  The right coronary artery is dominant and unremarkable with anterior origin. PDA was relatively short.  LEFT VENTRICULOGRAM:  Left ventricular angiogram was not done but echocardiogram done earlier showed normal left ventricular wall motion and systolic function with an estimated ejection fraction of 60-65%.  LVEDP was 15 mmHg.  IMPRESSION OF HEART CATHETERIZATION:   1. Normal left main coronary artery. 2. Minimal disease of left anterior descending artery and its branches are unremarkable. 3. Minimal disease of left circumflex artery and its branches. 4. Normal right coronary artery. 5. Normal left ventricular  systolic function.  LVEDP 15 mmHg.  Ejection fraction 60-65 %.  RECOMMENDATION:   Medical treatment.

## 2014-05-24 NOTE — Progress Notes (Signed)
ANTICOAGULATION CONSULT NOTE - Follow Up Consult  Pharmacy Consult for Heparin Indication: chest pain/ACS  No Known Allergies  Patient Measurements: Height: 5\' 10"  (177.8 cm) Weight: 173 lb 11.6 oz (78.8 kg) IBW/kg (Calculated) : 73 Heparin Dosing Weight: 78kg  Vital Signs: Temp: 97.7 F (36.5 C) (05/26 0820) Temp src: Oral (05/26 0820) BP: 134/74 mmHg (05/26 0820) Pulse Rate: 60 (05/26 0307)  Labs:  Recent Labs  05/21/14 1535  05/21/14 2137 05/22/14 0259 05/23/14 0250 05/24/14 0255  HGB  --   --   --   --  13.4 14.4  HCT  --   --   --   --  41.7 42.6  PLT  --   --   --   --  240 238  LABPROT  --   --   --   --   --  12.5  INR  --   --   --   --   --  0.95  HEPARINUNFRC  --   < > 0.53 0.50 0.32 0.36  CREATININE  --   --   --   --  1.43* 1.32  TROPONINI <0.30  --  <0.30 <0.30  --   --   < > = values in this interval not displayed.  Estimated Creatinine Clearance: 56.8 ml/min (by C-G formula based on Cr of 1.32).   Medications:  Heparin @ 950 units/hr  Assessment: 68yom continues on heparin for ACS with plans for cath today. Heparin level is at goal. CBC stable. No bleeding reported.  Goal of Therapy:  Heparin level 0.3-0.7 units/ml Monitor platelets by anticoagulation protocol: Yes   Plan:  1) Continue heparin at 950 units/hr 2) Follow up after cath  Benjamine Sprague  05/24/2014,10:05 AM

## 2014-05-24 NOTE — Care Management Note (Signed)
    Page 1 of 1   05/24/2014     8:24:37 AM CARE MANAGEMENT NOTE 05/24/2014  Patient:  Ronald Mccormick, Ronald Mccormick   Account Number:  [id number]  Date Initiated:  05/24/2014  Documentation initiated by:  Elissa Hefty  Subjective/Objective Assessment:   adm w ch pain     Action/Plan:   lives w wife, pcp dr Kathryne Eriksson   Anticipated DC Date:     Anticipated DC Plan:           Choice offered to / List presented to:             Status of service:   Medicare Important Message given?   (If response is "NO", the following Medicare IM given date fields will be blank) Date Medicare IM given:   Date Additional Medicare IM given:    Discharge Disposition:    Per UR Regulation:  Reviewed for med. necessity/level of care/duration of stay  If discussed at [place] of Stay Meetings, dates discussed:    Comments:

## 2014-05-24 NOTE — Interval H&P Note (Signed)
History and Physical Interval Note:  05/24/2014 11:09 AM  Ronald Mccormick  has presented today for surgery, with the diagnosis of ACS  The various methods of treatment have been discussed with the patient and family. After consideration of risks, benefits and other options for treatment, the patient has consented to  Procedure(s): LEFT HEART CATHETERIZATION WITH CORONARY/GRAFT ANGIOGRAM (N/A) as a surgical intervention .  The patient's history has been reviewed, patient examined, no change in status, stable for surgery.  I have reviewed the patient's chart and labs.  Questions were answered to the patient's satisfaction.     Birdie Riddle

## 2014-05-24 NOTE — H&P (View-Only) (Signed)
Ref: Woody Seller, MD   Subjective:  No chest pain. Mild renal dysfunction. Borderline per patient in past.  Objective:  Vital Signs in the last 24 hours: Temp:  [97.3 F (36.3 C)-98.2 F (36.8 C)] 97.3 F (36.3 C) (05/25 1148) Pulse Rate:  [51-68] 52 (05/25 1148) Cardiac Rhythm:  [-] Sinus bradycardia (05/25 0826) Resp:  [15-20] 20 (05/25 1148) BP: (115-152)/(65-90) 152/90 mmHg (05/25 1148) SpO2:  [96 %-99 %] 99 % (05/25 1148) Weight:  [77.1 kg (169 lb 15.6 oz)] 77.1 kg (169 lb 15.6 oz) (05/25 0923)  Physical Exam: BP Readings from Last 1 Encounters:  05/23/14 152/90    Wt Readings from Last 1 Encounters:  05/23/14 77.1 kg (169 lb 15.6 oz)    Weight change: -1.826 kg (-4 lb 0.4 oz)  HEENT: [place]/AT, Eyes- PERL, EOMI, Conjunctiva-Pink, Sclera-Non-icteric Neck: No JVD, No bruit, Trachea midline. Lungs:  Clear, Bilateral. Cardiac:  Regular rhythm, normal S1 and S2, no S3.  Abdomen:  Soft, non-tender. Extremities:  No edema present. No cyanosis. No clubbing. CNS: AxOx3, Cranial nerves grossly intact, moves all 4 extremities. Right handed. Skin: Warm and dry.   Intake/Output from previous day: 05/24 0701 - 05/25 0700 In: 958 [P.O.:480; I.V.:478] Out: 1325 [Urine:1325]    Lab Results: BMET    Component Value Date/Time   NA 139 05/23/2014 0250   NA 139 05/21/2014 0936   NA 140 06/22/2009 0820   K 4.3 05/23/2014 0250   K 4.6 05/21/2014 0936   K 4.2 06/22/2009 0820   CL 106 05/23/2014 0250   CL 101 05/21/2014 0936   CL 105 06/22/2009 0820   CO2 25 05/23/2014 0250   CO2 21 05/21/2014 0936   CO2 29 06/22/2009 0820   GLUCOSE 109* 05/23/2014 0250   GLUCOSE 133* 05/21/2014 0936   GLUCOSE 85 06/22/2009 0820   BUN 20 05/23/2014 0250   BUN 18 05/21/2014 0936   BUN 15 06/22/2009 0820   CREATININE 1.43* 05/23/2014 0250   CREATININE 1.33 05/21/2014 0936   CREATININE 1.2 06/22/2009 0820   CALCIUM 8.7 05/23/2014 0250   CALCIUM 9.6 05/21/2014 0936   CALCIUM 8.9 06/22/2009 0820   GFRNONAA  50* 05/23/2014 0250   GFRNONAA 54* 05/21/2014 0936   GFRNONAA 65.34 06/22/2009 0820   GFRAA 57* 05/23/2014 0250   GFRAA 63* 05/21/2014 0936   GFRAA 72 05/20/2008 0821   CBC    Component Value Date/Time   WBC 7.4 05/23/2014 0250   RBC 4.67 05/23/2014 0250   HGB 13.4 05/23/2014 0250   HCT 41.7 05/23/2014 0250   PLT 240 05/23/2014 0250   MCV 89.3 05/23/2014 0250   MCH 28.7 05/23/2014 0250   MCHC 32.1 05/23/2014 0250   RDW 13.3 05/23/2014 0250   LYMPHSABS 1.8 05/21/2014 0936   MONOABS 0.5 05/21/2014 0936   EOSABS 0.0 05/21/2014 0936   BASOSABS 0.0 05/21/2014 0936   HEPATIC Function Panel  Recent Labs  05/21/14 0936  PROT 7.4   HEMOGLOBIN A1C No components found with this basename: HGA1C,  MPG   CARDIAC ENZYMES Lab Results  Component Value Date   TROPONINI <0.30 05/22/2014   TROPONINI <0.30 05/21/2014   TROPONINI <0.30 05/21/2014   BNP No results found for this basename: PROBNP,  in the last 8760 hours TSH No results found for this basename: TSH,  in the last 8760 hours CHOLESTEROL  Recent Labs  05/23/14 0250  CHOL 179    Scheduled Meds: . aspirin EC  81 mg Oral Daily  .  atorvastatin  40 mg Oral q1800  . dexlansoprazole  60 mg Oral Daily  . metoprolol tartrate  25 mg Oral BID  . multivitamin with minerals  1 tablet Oral Daily   Continuous Infusions: . sodium chloride 100 mL/hr at 05/23/14 1139  . [START ON 05/24/2014] sodium chloride    . heparin 950 Units/hr (05/22/14 1938)   PRN Meds:.acetaminophen, morphine injection, nitroGLYCERIN, ondansetron (ZOFRAN) IV  Assessment/Plan: Acute coronary syndrome  Tobacco use disorder  GERD  Hyperlipidemia  H/O colon polyps CKD, II  IV hydration. Cardiac cath in AM.     LOS: 2 days    Dixie Dials  MD  05/23/2014, 1:40 PM

## 2014-08-25 ENCOUNTER — Other Ambulatory Visit: Payer: Self-pay | Admitting: Dermatology

## 2014-09-22 ENCOUNTER — Other Ambulatory Visit: Payer: Self-pay | Admitting: Gastroenterology

## 2014-12-08 ENCOUNTER — Encounter (HOSPITAL_COMMUNITY): Payer: Self-pay | Admitting: Cardiovascular Disease

## 2015-06-26 ENCOUNTER — Other Ambulatory Visit: Payer: Self-pay

## 2015-08-28 ENCOUNTER — Encounter: Payer: Self-pay | Admitting: Gastroenterology

## 2016-03-12 DIAGNOSIS — N529 Male erectile dysfunction, unspecified: Secondary | ICD-10-CM | POA: Insufficient documentation

## 2016-03-12 DIAGNOSIS — R7309 Other abnormal glucose: Secondary | ICD-10-CM | POA: Insufficient documentation

## 2016-03-12 DIAGNOSIS — G5791 Unspecified mononeuropathy of right lower limb: Secondary | ICD-10-CM | POA: Insufficient documentation

## 2016-03-12 DIAGNOSIS — H6980 Other specified disorders of Eustachian tube, unspecified ear: Secondary | ICD-10-CM | POA: Insufficient documentation

## 2016-03-12 DIAGNOSIS — H6990 Unspecified Eustachian tube disorder, unspecified ear: Secondary | ICD-10-CM | POA: Insufficient documentation

## 2016-03-12 DIAGNOSIS — H6690 Otitis media, unspecified, unspecified ear: Secondary | ICD-10-CM | POA: Insufficient documentation

## 2016-03-12 DIAGNOSIS — H9190 Unspecified hearing loss, unspecified ear: Secondary | ICD-10-CM | POA: Insufficient documentation

## 2016-03-12 DIAGNOSIS — H9313 Tinnitus, bilateral: Secondary | ICD-10-CM | POA: Insufficient documentation

## 2016-03-13 DIAGNOSIS — K649 Unspecified hemorrhoids: Secondary | ICD-10-CM | POA: Insufficient documentation

## 2016-03-13 DIAGNOSIS — N289 Disorder of kidney and ureter, unspecified: Secondary | ICD-10-CM | POA: Insufficient documentation

## 2017-11-28 DIAGNOSIS — H6983 Other specified disorders of Eustachian tube, bilateral: Secondary | ICD-10-CM | POA: Insufficient documentation

## 2017-11-28 DIAGNOSIS — H6993 Unspecified Eustachian tube disorder, bilateral: Secondary | ICD-10-CM | POA: Insufficient documentation

## 2017-11-28 DIAGNOSIS — H903 Sensorineural hearing loss, bilateral: Secondary | ICD-10-CM | POA: Insufficient documentation

## 2018-02-21 ENCOUNTER — Emergency Department (HOSPITAL_COMMUNITY)
Admission: EM | Admit: 2018-02-21 | Discharge: 2018-02-21 | Disposition: A | Discharge status: Home / Self Care | Payer: Medicare Other | Attending: Emergency Medicine | Admitting: Emergency Medicine

## 2018-02-21 ENCOUNTER — Emergency Department (HOSPITAL_COMMUNITY): Payer: Medicare Other

## 2018-02-21 ENCOUNTER — Encounter (HOSPITAL_COMMUNITY): Payer: Self-pay

## 2018-02-21 DIAGNOSIS — R0789 Other chest pain: Secondary | ICD-10-CM | POA: Diagnosis not present

## 2018-02-21 DIAGNOSIS — Z87891 Personal history of nicotine dependence: Secondary | ICD-10-CM | POA: Insufficient documentation

## 2018-02-21 DIAGNOSIS — R079 Chest pain, unspecified: Secondary | ICD-10-CM | POA: Diagnosis present

## 2018-02-21 LAB — CBC
HEMATOCRIT: 43.3 % (ref 39.0–52.0)
Hemoglobin: 15.1 g/dL (ref 13.0–17.0)
MCH: 30.5 pg (ref 26.0–34.0)
MCHC: 34.9 g/dL (ref 30.0–36.0)
MCV: 87.5 fL (ref 78.0–100.0)
Platelets: 253 10*3/uL (ref 150–400)
RBC: 4.95 MIL/uL (ref 4.22–5.81)
RDW: 12.9 % (ref 11.5–15.5)
WBC: 12 10*3/uL — AB (ref 4.0–10.5)

## 2018-02-21 LAB — BASIC METABOLIC PANEL
ANION GAP: 12 (ref 5–15)
BUN: 16 mg/dL (ref 6–20)
CO2: 20 mmol/L — ABNORMAL LOW (ref 22–32)
Calcium: 8.6 mg/dL — ABNORMAL LOW (ref 8.9–10.3)
Chloride: 107 mmol/L (ref 101–111)
Creatinine, Ser: 1.37 mg/dL — ABNORMAL HIGH (ref 0.61–1.24)
GFR calc Af Amer: 59 mL/min — ABNORMAL LOW (ref >60)
GFR, EST NON AFRICAN AMERICAN: 51 mL/min — AB (ref >60)
Glucose, Bld: 149 mg/dL — ABNORMAL HIGH (ref 65–99)
POTASSIUM: 4 mmol/L (ref 3.5–5.1)
SODIUM: 139 mmol/L (ref 135–145)

## 2018-02-21 LAB — I-STAT TROPONIN, ED
Troponin i, poc: 0 ng/mL (ref 0.00–0.08)
Troponin i, poc: 0 ng/mL (ref 0.00–0.08)

## 2018-02-21 MED ORDER — MORPHINE SULFATE (PF) 4 MG/ML IV SOLN
4.0000 mg | Freq: Once | INTRAVENOUS | Status: AC
  Administered 2018-02-21: 4 mg via INTRAVENOUS
  Filled 2018-02-21: qty 1

## 2018-02-21 MED ORDER — IOPAMIDOL (ISOVUE-370) INJECTION 76%
INTRAVENOUS | Status: AC
  Administered 2018-02-21: 100 mL
  Filled 2018-02-21: qty 100

## 2018-02-21 NOTE — Discharge Instructions (Signed)
You were seen today for chest pain.  Your workup is reassuring including CT scan and multiple heart test.  You had a cardiac catheterization 3 years ago that was reassuring.  If you have recurrent pain, you should follow-up with Dr. Doylene Canard for repeat evaluation.  Incidentally you were noted to have a gallstone.  It is unclear whether this is the cause of your pain.  If you start having pain with eating, you should follow-up with general surgery.

## 2018-02-21 NOTE — ED Provider Notes (Signed)
[facility] EMERGENCY DEPARTMENT Provider Note   CSN: [id number] Arrival date & time: 02/21/18  7253     History   Chief Complaint Chief Complaint  Patient presents with  . Chest Pain    HPI Ronald Mccormick is a 72 y.o. male.  HPI  This is a 72-year reflux, hyperlipidemia who presents with chest pain.  Patient reports that he had chest pain that woke him up from sleep.  It is anterior and sharp and radiates to the bilateral back.  It does not appear to be worsened with exertion.  He reports associated shortness of breath.  No diaphoresis.  He has never had pain like this before.  He was given aspirin and nitroglycerin in the ambulance.  Very minimal relief.  Reports some relief with morphine.  Currently his pain is 5 out of 10.  Initially was 8 out of 10.  Denies any lower extremity swelling or history of blood clots.  He does have a history of reflux and takes Dexilant.  Patient had a cardiac catheterization in 2015.  At that time he was noted to have very minimal coronary artery calcifications.  Medical management was recommended.  Of note during that time he also had a CT a of the chest and abdomen.  He had some incidental findings and mild ectasia of the ascending aorta with a maximum diameter of 4.1 cm.  Past Medical History:  Diagnosis Date  . Colon polyps   . GERD (gastroesophageal reflux disease)   . Hyperlipidemia     Patient Active Problem List   Diagnosis Date Noted  . ACS (acute coronary syndrome) ([place]) 05/21/2014  . Family history of malignant neoplasm of gastrointestinal tract 09/22/2013  . Personal history of colonic polyps 09/22/2013  . HYPERLIPIDEMIA 05/27/2007  . GERD 05/27/2007    Past Surgical History:  Procedure Laterality Date  . LEFT HEART CATHETERIZATION WITH CORONARY/GRAFT ANGIOGRAM N/A 05/24/2014   Procedure: LEFT HEART CATHETERIZATION WITH Beatrix Fetters;  Surgeon: Birdie Riddle, MD;  Location: [facility];  Service:  Cardiovascular;  Laterality: N/A;  . ORIF SHOULDER FRACTURE Right 1965  . STAPEDES SURGERY Right 1986  . TONSILLECTOMY  1954       Home Medications    Prior to Admission medications   Medication Sig Start Date End Date Taking? Authorizing Provider  DEXILANT 60 MG capsule TAKE 1 CAPSULE DAILY 09/22/14  Yes Inda Castle, MD  Multiple Vitamin (MULTIVITAMIN) tablet Take 1 tablet by mouth daily.   Yes [provider]  vitamin B-12 (CYANOCOBALAMIN) 1000 MCG tablet Take 1,000 mcg by mouth daily.   Yes [provider]    Family History Family History  Problem Relation Age of Onset  . Colon cancer Father 17  . Esophageal cancer Father   . Hypertension Mother   . Stroke Mother   . Cancer Maternal Grandfather        mets    Social History Social History   Tobacco Use  . Smoking status: Former Smoker    Types: Pipe    Start date: 12/30/1978  . Smokeless tobacco: Never Used  Substance Use Topics  . Alcohol use: Yes    Alcohol/week: 8.4 oz    Types: 14 Cans of beer per week    Comment: 1-2 per day  . Drug use: No     Allergies   Patient has no known allergies.   Review of Systems Review of Systems  Constitutional: Negative for fever.  Respiratory:  Positive for shortness of breath. Negative for cough.   Cardiovascular: Positive for chest pain. Negative for leg swelling.  Gastrointestinal: Negative for abdominal pain, nausea and vomiting.  Genitourinary: Negative for dysuria and hematuria.  Neurological: Negative for weakness, numbness and headaches.  All other systems reviewed and are negative.    Physical Exam Updated Vital Signs BP 125/76 (BP Location: Right Arm)   Pulse (!) 53   Temp (!) 97.5 F (36.4 C) (Oral)   Resp 20   Ht 5\' 10"  (1.778 m)   Wt 80.7 kg (178 lb)   SpO2 100%   BMI 25.54 kg/m   Physical Exam  Constitutional: He is oriented to person, place, and time. He appears well-developed and well-nourished. He does not appear  ill.  HENT:  Head: Normocephalic and atraumatic.  Cardiovascular: Normal rate, regular rhythm, normal heart sounds and normal pulses.  No murmur heard. Pulmonary/Chest: Effort normal and breath sounds normal. No respiratory distress. He has no wheezes.  Abdominal: Soft. Bowel sounds are normal. There is no tenderness. There is no rebound.  Musculoskeletal: He exhibits no edema.  Neurological: He is alert and oriented to person, place, and time.  Skin: Skin is warm and dry.  Psychiatric: He has a normal mood and affect.  Nursing note and vitals reviewed.    ED Treatments / Results  Labs (all labs ordered are listed, but only abnormal results are displayed) Labs Reviewed  BASIC METABOLIC PANEL - Abnormal; Notable for the following components:      Result Value   CO2 20 (*)    Glucose, Bld 149 (*)    Creatinine, Ser 1.37 (*)    Calcium 8.6 (*)    GFR calc non Af Amer 51 (*)    GFR calc Af Amer 59 (*)    All other components within normal limits  CBC - Abnormal; Notable for the following components:   WBC 12.0 (*)    All other components within normal limits  I-STAT TROPONIN, ED  I-STAT TROPONIN, ED    EKG  EKG Interpretation  Date/Time:  Saturday February 21 2018 00:48:30 EST Ventricular Rate:  51 PR Interval:  200 QRS Duration: 96 QT Interval:  430 QTC Calculation: 396 R Axis:   0 Text Interpretation:  Sinus bradycardia Minimal voltage criteria for LVH, may be normal variant Borderline ECG No significant change since last tracing Confirmed by Thayer Jew [phone]) on 02/21/2018 12:58:12 AM       Radiology Dg Chest 2 View  Result Date: 02/21/2018 CLINICAL DATA:  Chest and back pain EXAM: CHEST  2 VIEW COMPARISON:  CT 05/21/2014 FINDINGS: Mild cardiomegaly. Streaky and patchy left basilar atelectasis or infiltrate. Streaky atelectasis at the right base. Mild aortic atherosclerosis. No pneumothorax. Stable minimal midthoracic vertebral wedging. IMPRESSION: 1. Patchy  atelectasis or infiltrate at the left lung base. Minimal streaky atelectasis right lung base 2. Borderline to mild cardiomegaly Electronically Signed   By: Donavan Foil M.D.   On: 02/21/2018 01:36   Ct Angio Chest/abd/pel For Dissection W And/or Wo Contrast  Result Date: 02/21/2018 CLINICAL DATA:  Awoke tonight with chest pain radiating to the chest and back. Concern for aortic dissection. EXAM: CT ANGIOGRAPHY CHEST, ABDOMEN AND PELVIS TECHNIQUE: Multidetector CT imaging through the chest, abdomen and pelvis was performed using the standard protocol during bolus administration of intravenous contrast. Multiplanar reconstructed images and MIPs were obtained and reviewed to evaluate the vascular anatomy. CONTRAST:  116mL ISOVUE-370 IOPAMIDOL (ISOVUE-370) INJECTION 76% COMPARISON:  05/21/2014. FINDINGS:  CTA CHEST FINDINGS Cardiovascular: Heart size is normal. No pericardial fluid. The aorta does not show any atherosclerotic calcification. No aortic dissection. Mild motion artifact at the aortic root. Maximal diameter of the ascending aorta is 3.7 cm. No coronary artery calcification is seen. Pulmonary arteries appear normal. Small amount of atherosclerotic calcification of the left subclavian artery distal to the vertebral artery origin, but without flow limiting stenosis. Mediastinum/Nodes: No mass or lymphadenopathy. Lungs/Pleura: Bochdalek's hernia the posterior left hemidiaphragm containing only fat. Some associated atelectasis of the adjacent lung. Subpleural nodule in the right middle lobe measuring 6 mm, without worrisome characteristics. Musculoskeletal: Ordinary mild thoracic degenerative changes. No other bone finding. Review of the MIP images confirms the above findings. CTA ABDOMEN AND PELVIS FINDINGS VASCULAR Aorta: Normal caliber.  No atherosclerotic disease identified. Celiac: Normal SMA: Normal Renals: Single and normal on the right. Duplicated on the left. No abnormal finding. IMA: Normal Inflow:  Normal Veins: No abnormal venous finding. Review of the MIP images confirms the above findings. NON-VASCULAR Hepatobiliary: Normal appearance of the liver parenchyma. 1 cm gallstone in the gallbladder neck without CT evidence of cholecystitis. Pancreas: Normal Spleen: Normal Adrenals/Urinary Tract: Adrenal glands are normal. Kidneys are normal except for a 2.8 cm cyst at the lower pole on the left. Bladder is normal. Stomach/Bowel: No abnormal bowel finding. Lymphatic: No adenopathy. Reproductive: Prominent prostate. Other: Bilateral inguinal hernias containing fat. Musculoskeletal: Chronic lower lumbar degenerative changes. Review of the MIP images confirms the above findings. IMPRESSION: No evidence of aortic dissection or other acute vascular pathology. Remarkable absence of atherosclerotic change for male of this age. The only atherosclerotic change I can find anywhere in the region is a small amount of plaque affecting the left subclavian artery distal to the origin of the left vertebral artery, without significant stenosis. No abnormality is seen to explain the presenting symptoms. 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 This is undoubtedly chronic and it would not be expected to be symptomatic. 1 cm gallstone in the gallbladder neck but without CT evidence of cholecystitis or biliary obstruction. Ordinary lower lumbar spinal degenerative changes. 6 mm nodule in the right middle lobe, unchanged since 2015 and benign. Electronically Signed   By: Nelson Chimes M.D.   On: 02/21/2018 07:01    Procedures Procedures (including critical care time)  Medications Ordered in ED Medications  morphine 4 MG/ML injection 4 mg (4 mg Intravenous Given 02/21/18 0154)  iopamidol (ISOVUE-370) 76 % injection (100 mLs  Contrast Given 02/21/18 0556)     Initial Impression / Assessment and Plan / ED Course  I have  reviewed the triage vital signs and the nursing notes.  Pertinent labs & imaging results that were available during my care of the patient were reviewed by me and considered in my medical decision making (see chart for details).     Patient presents with chest pain.  Reports that it awoke him from sleep.  Radiation to the back bilateral sides.  He is nontoxic appearing and vital signs are reassuring.  He is afebrile.  Denies infectious symptoms or recent illnesses.  EKG shows no evidence of ischemia.  Initial troponin is negative.  Chest x-ray without pneumothorax or pneumonia.  Chart reviewed as above.  Given radiation to back, CT scan to rule out dissection was obtained.  CT scan is largely unremarkable.  Incidental gallstone noted.  Given history and location of pain,  doubt this is the source.  Patient has had complete resolution of symptoms while in the emergency room.  Repeat troponin is negative.  Doubt ACS.  Patient reassured and workup was discussed.  Follow-up with primary physician.  Cardiology follow-up also recommended.  After history, exam, and medical workup I feel the patient has been appropriately medically screened and is safe for discharge home. Pertinent diagnoses were discussed with the patient. Patient was given return precautions.  Final Clinical Impressions(s) / ED Diagnoses   Final diagnoses:  Atypical chest pain    ED Discharge Orders    None       Horton, Barbette Hair, MD 02/21/18 [phone]

## 2018-02-21 NOTE — ED Triage Notes (Signed)
Pt comes via [facility] for CP, L sided started about an hour ago, woke him up, some SOB, nausea, PTA received 4mg  zofran, 4mg  morphine and two nitro.

## 2018-02-26 ENCOUNTER — Ambulatory Visit (INDEPENDENT_AMBULATORY_CARE_PROVIDER_SITE_OTHER): Payer: Medicare Other | Admitting: Gastroenterology

## 2018-02-26 ENCOUNTER — Encounter: Payer: Self-pay | Admitting: Gastroenterology

## 2018-02-26 ENCOUNTER — Other Ambulatory Visit (INDEPENDENT_AMBULATORY_CARE_PROVIDER_SITE_OTHER): Payer: Medicare Other

## 2018-02-26 VITALS — BP 134/86 | HR 76 | Ht 69.0 in | Wt 180.0 lb

## 2018-02-26 DIAGNOSIS — R079 Chest pain, unspecified: Secondary | ICD-10-CM | POA: Diagnosis not present

## 2018-02-26 DIAGNOSIS — R109 Unspecified abdominal pain: Secondary | ICD-10-CM

## 2018-02-26 DIAGNOSIS — Z800 Family history of malignant neoplasm of digestive organs: Secondary | ICD-10-CM | POA: Diagnosis not present

## 2018-02-26 DIAGNOSIS — K8020 Calculus of gallbladder without cholecystitis without obstruction: Secondary | ICD-10-CM | POA: Diagnosis not present

## 2018-02-26 DIAGNOSIS — K219 Gastro-esophageal reflux disease without esophagitis: Secondary | ICD-10-CM

## 2018-02-26 LAB — HEPATIC FUNCTION PANEL
ALBUMIN: 4 g/dL (ref 3.5–5.2)
ALK PHOS: 73 U/L (ref 39–117)
ALT: 22 U/L (ref 0–53)
AST: 16 U/L (ref 0–37)
BILIRUBIN TOTAL: 0.5 mg/dL (ref 0.2–1.2)
Bilirubin, Direct: 0.1 mg/dL (ref 0.0–0.3)
Total Protein: 7.9 g/dL (ref 6.0–8.3)

## 2018-02-26 NOTE — Patient Instructions (Addendum)
If you are age 69 or older, your body mass index should be between 23-30. Your Body mass index is 26.58 kg/m. If this is out of the aforementioned range listed, please consider follow up with your Primary Care Provider.  If you are age 76 or younger, your body mass index should be between 19-25. Your Body mass index is 26.58 kg/m. If this is out of the aformentioned range listed, please consider follow up with your Primary Care Provider.   Please go to the lab in the basement of our building to have lab work done as you leave today.  Thank you for entrusting me with your care and for choosing [facility], Dr. Rutledge Cellar

## 2018-02-26 NOTE — Progress Notes (Signed)
HPI :  72 y/o male with history of GERD, colon polyps, HLD, here for a new patient visit . Last seen in our office by Dr. Deatra Ina in 2014.   The patient was seen in the emergency room on Saturday morning with pains in his upper mid abdomen and into his chest, that also radiated to his back.  He states this started early in the morning and awoke him from sleep.  He had a CT angiogram which ruled out any evidence of pulmonary embolism or dissection.  His cardiac enzymes were normal he was not thought to be having a heart attack.  He had a white blood cell count of 12 without anemia.  His liver function testing was not performed.  Incidentally noted on the CT angiogram was a 1 cm gallstone in the neck of the gallbladder, without any evidence of biliary ductal dilation.  He states his symptoms are significantly improved.  He does have some mild soreness in the right upper quadrant to the right mid back which bothers him slightly.  He denies any nausea or vomiting.  He is eating well.  Eating does not make his symptoms worse.  He denies any reflux symptoms that are bothering him.  He takes Dexilant 60 mg once daily.  He states this generally works well without breakthrough.  He denies any heartburn.  He denies any dysphagia.  He does state occasionally when he eats a large meal he will have epigastric discomfort which bothers him.  He denies any focal right upper quadrant pain in the postprandial setting or shoulder or back pain and associated with his epigastric discomfort.  Interestingly he had a similar episode of severe pain in his upper abdomen and into his chest in 2015.  He also had a CT angiogram done at that time which also showed gallstones and no other significant pathology.  His liver function testing was normal at that time.  Father colon cancer age 57s. There is a report of esophageal cancer in his father but it sounds like he had an upper GI bleed and not esophageal cancer.   EGD 09/22/2013  - irregular z-line, multiple inflamed gastric polyps, hiatal hernia - fundic gland polyps, no evidence of Barrett's Colonoscopy 09/27/2013 - hyperplastic rectal polyp    Past Medical History:  Diagnosis Date  . Colon polyps   . Gallstone   . GERD (gastroesophageal reflux disease)   . Hyperlipidemia      Past Surgical History:  Procedure Laterality Date  . LEFT HEART CATHETERIZATION WITH CORONARY/GRAFT ANGIOGRAM N/A 05/24/2014   Procedure: LEFT HEART CATHETERIZATION WITH Beatrix Fetters;  Surgeon: Birdie Riddle, MD;  Location: [facility]ATH [facility];  Service: Cardiovascular;  Laterality: N/A;  . ORIF SHOULDER FRACTURE Right 1965  . STAPEDES SURGERY Right 1986  . TONSILLECTOMY  1954   Family History  Problem Relation Age of Onset  . Colon cancer Father 64  . Esophageal cancer Father   . Hypertension Mother   . Stroke Mother   . Cancer Maternal Grandfather        mets   Social History   Tobacco Use  . Smoking status: Former Smoker    Types: Pipe    Start date: 12/30/1978  . Smokeless tobacco: Never Used  Substance Use Topics  . Alcohol use: Yes    Alcohol/week: 8.4 oz    Types: 14 Cans of beer per week    Comment: 1-2 per day  . Drug use: No   Current Outpatient  Medications  Medication Sig Dispense Refill  . DEXILANT 60 MG capsule TAKE 1 CAPSULE DAILY 90 capsule 2  . Multiple Vitamin (MULTIVITAMIN) tablet Take 1 tablet by mouth daily.    . vitamin B-12 (CYANOCOBALAMIN) 1000 MCG tablet Take 1,000 mcg by mouth daily.     No current facility-administered medications for this visit.    No Known Allergies   Review of Systems: All systems reviewed and negative except where noted in HPI.    Dg Chest 2 View  Result Date: 02/21/2018 CLINICAL DATA:  Chest and back pain EXAM: CHEST  2 VIEW COMPARISON:  CT 05/21/2014 FINDINGS: Mild cardiomegaly. Streaky and patchy left basilar atelectasis or infiltrate. Streaky atelectasis at the right base. Mild aortic atherosclerosis. No  pneumothorax. Stable minimal midthoracic vertebral wedging. IMPRESSION: 1. Patchy atelectasis or infiltrate at the left lung base. Minimal streaky atelectasis right lung base 2. Borderline to mild cardiomegaly Electronically Signed   By: Donavan Foil M.D.   On: 02/21/2018 01:36   Ct Angio Chest/abd/pel For Dissection W And/or Wo Contrast  Result Date: 02/21/2018 CLINICAL DATA:  Awoke tonight with chest pain radiating to the chest and back. Concern for aortic dissection. EXAM: CT ANGIOGRAPHY CHEST, ABDOMEN AND PELVIS TECHNIQUE: Multidetector CT imaging through the chest, abdomen and pelvis was performed using the standard protocol during bolus administration of intravenous contrast. Multiplanar reconstructed images and MIPs were obtained and reviewed to evaluate the vascular anatomy. CONTRAST:  167mL ISOVUE-370 IOPAMIDOL (ISOVUE-370) INJECTION 76% COMPARISON:  05/21/2014. FINDINGS: CTA CHEST FINDINGS Cardiovascular: Heart size is normal. No pericardial fluid. The aorta does not show any atherosclerotic calcification. No aortic dissection. Mild motion artifact at the aortic root. Maximal diameter of the ascending aorta is 3.7 cm. No coronary artery calcification is seen. Pulmonary arteries appear normal. Small amount of atherosclerotic calcification of the left subclavian artery distal to the vertebral artery origin, but without flow limiting stenosis. Mediastinum/Nodes: No mass or lymphadenopathy. Lungs/Pleura: Bochdalek's hernia the posterior left hemidiaphragm containing only fat. Some associated atelectasis of the adjacent lung. Subpleural nodule in the right middle lobe measuring 6 mm, without worrisome characteristics. Musculoskeletal: Ordinary mild thoracic degenerative changes. No other bone finding. Review of the MIP images confirms the above findings. CTA ABDOMEN AND PELVIS FINDINGS VASCULAR Aorta: Normal caliber.  No atherosclerotic disease identified. Celiac: Normal SMA: Normal Renals: Single and  normal on the right. Duplicated on the left. No abnormal finding. IMA: Normal Inflow: Normal Veins: No abnormal venous finding. Review of the MIP images confirms the above findings. NON-VASCULAR Hepatobiliary: Normal appearance of the liver parenchyma. 1 cm gallstone in the gallbladder neck without CT evidence of cholecystitis. Pancreas: Normal Spleen: Normal Adrenals/Urinary Tract: Adrenal glands are normal. Kidneys are normal except for a 2.8 cm cyst at the lower pole on the left. Bladder is normal. Stomach/Bowel: No abnormal bowel finding. Lymphatic: No adenopathy. Reproductive: Prominent prostate. Other: Bilateral inguinal hernias containing fat. Musculoskeletal: Chronic lower lumbar degenerative changes. Review of the MIP images confirms the above findings. IMPRESSION: No evidence of aortic dissection or other acute vascular pathology. Remarkable absence of atherosclerotic change for male of this age. The only atherosclerotic change I can find anywhere in the region is a small amount of plaque affecting the left subclavian artery distal to the origin of the left vertebral artery, without significant stenosis. No abnormality is seen to explain the presenting symptoms. 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  This is undoubtedly chronic and it would not be expected to be symptomatic. 1 cm gallstone in the gallbladder neck but without CT evidence of cholecystitis or biliary obstruction. Ordinary lower lumbar spinal degenerative changes. 6 mm nodule in the right middle lobe, unchanged since 2015 and benign. Electronically Signed   By: Nelson Chimes M.D.   On: 02/21/2018 07:01   Lab Results  Component Value Date   ALT 16 05/21/2014   AST 17 05/21/2014   ALKPHOS 84 05/21/2014   BILITOT 0.3 05/21/2014    Lab Results  Component Value Date   CREATININE 1.37 (H) 02/21/2018   BUN 16 02/21/2018   NA 139  02/21/2018   K 4.0 02/21/2018   CL 107 02/21/2018   CO2 20 (L) 02/21/2018    Lab Results  Component Value Date   WBC 12.0 (H) 02/21/2018   HGB 15.1 02/21/2018   HCT 43.3 02/21/2018   MCV 87.5 02/21/2018   PLT 253 02/21/2018      Physical Exam: BP 134/86 (BP Location: Left Arm, Patient Position: Sitting, Cuff Size: Normal)   Pulse 76   Ht 5\' 9"  (1.753 m)   Wt 180 lb (81.6 kg)   BMI 26.58 kg/m  Constitutional: Pleasant,well-developed, male in no acute distress. HEENT: Normocephalic and atraumatic. Conjunctivae are normal. No scleral icterus. Neck supple.  Cardiovascular: Normal rate, regular rhythm.  Pulmonary/chest: Effort normal and breath sounds normal. No wheezing, rales or rhonchi. Abdominal: Soft, nondistended, mild RUQ TTP. There are no masses palpable. No hepatomegaly. Extremities: no edema Lymphadenopathy: No cervical adenopathy noted. Neurological: Alert and oriented to person place and time. Skin: Skin is warm and dry. No rashes noted. Psychiatric: Normal mood and affect. Behavior is normal.   ASSESSMENT AND PLAN: 72 year old male here to establish care for issues as outlined below:  Abdominal pain / chest pain / gallstones - 2 distinct episodes of severe upper abdominal pain with radiation into the chest and back.  He has had 2 CT scans which have shown gallstones and no other clear pathology.  His liver enzymes were normal in 2015 but not repeated during this recent episode.  His symptoms have significantly improved however he continues to have some right upper quadrant pain.  At baseline he has some occasional epigastric discomfort with large meals but not too frequently.  It is possible that his symptoms are related to gallstones but it is not a typical history for biliary colic.  We discussed options.  I am going to check his LFTs today to make sure they are normal in light of this recent episode and his ongoing mild discomfort.  We then discussed whether or not  he wants to see a surgeon to discuss possible cholecystectomy.  I counseled him on the risks of gallstones to include choledocholithiasis and pancreatitis, and the risks of surgery for cholecystectomy.  I also counseled him that he could undergo cholecystectomy and the symptoms could still persist.  He wants to think about it, wait LFTs, will let him know result.  GERD - he has been on Dexilant for a long time at present dose.Marland Kitchen His prior EGD showed no evidence of Barrett's esophagus.  He does have some mild chronic kidney disease.  I discussed the long-term risks and benefits of PPI use, and recommend the lowest dose of Dexilant needed to control symptoms.  He reports failure of of other PPIs in the past.  He was agreeable to decrease Dexilant to 30 mg once a day.  He otherwise  asked about a surveillance upper endoscopy.  He is not sure if his father had esophageal cancer, his last EGD showed no evidence of Barrett's.  I do not feel strongly that he needs another endoscopy but he is rather anxious about this strongly wants an exam.  I offered him one for peace of mind, he can schedule at his convenience.  Family history of colon cancer -he has not had an adenoma on his last colonoscopy.  His father was diagnosed in his mid 47s colon cancer.  Based on guidelines he is not due for colonoscopy for another 5 years.  I reassured him about this recommendation, however he remains anxious and wants another colonoscopy following a lengthy discussion about this issue.  I again offered him an exam for peace of mind, he will call to schedule when he is ready.  [place] Cellar, MD [facility] Pager [phone]  CC: Christain Sacramento, MD

## 2018-03-02 DIAGNOSIS — K8020 Calculus of gallbladder without cholecystitis without obstruction: Secondary | ICD-10-CM | POA: Insufficient documentation

## 2018-03-12 ENCOUNTER — Other Ambulatory Visit: Payer: Self-pay

## 2018-03-12 ENCOUNTER — Other Ambulatory Visit (INDEPENDENT_AMBULATORY_CARE_PROVIDER_SITE_OTHER): Payer: Medicare Other

## 2018-03-12 ENCOUNTER — Telehealth: Payer: Self-pay | Admitting: Gastroenterology

## 2018-03-12 DIAGNOSIS — R1011 Right upper quadrant pain: Secondary | ICD-10-CM | POA: Diagnosis not present

## 2018-03-12 LAB — HEPATIC FUNCTION PANEL
ALK PHOS: 70 U/L (ref 39–117)
ALT: 23 U/L (ref 0–53)
AST: 17 U/L (ref 0–37)
Albumin: 4.1 g/dL (ref 3.5–5.2)
BILIRUBIN DIRECT: 0.1 mg/dL (ref 0.0–0.3)
BILIRUBIN TOTAL: 0.5 mg/dL (ref 0.2–1.2)
TOTAL PROTEIN: 7.3 g/dL (ref 6.0–8.3)

## 2018-03-12 LAB — LIPASE: Lipase: 19 U/L (ref 11.0–59.0)

## 2018-03-12 NOTE — Telephone Encounter (Signed)
Ronald Mccormick please reassure him that he just had a CT scan done of his abdomen and chest without any evidence of cancer.  We can send him for LFTs and lipase level to ensure normal, but my suspicion is that his gallstones may be causing his pain. I recommend that he see a surgeon who can discuss this further with him, can you help refer him if he is willing? We will otherwise check the labs to ensure his pancreas is okay. Thanks

## 2018-03-12 NOTE — Telephone Encounter (Signed)
Patient reassured, labs ordered and will fax over referral to [facility] to schedule consult for cholelithiasis, patient requesting Dr. Ninfa Linden.

## 2018-03-12 NOTE — Telephone Encounter (Signed)
Patient still having RUQ pain, radiates to his back, also bloating. Looking back at result notes had offered to refer him to [facility]. Patient is concerned that maybe more testing needs to be done first to look for possible pancreatitis or cancer.

## 2018-03-19 ENCOUNTER — Ambulatory Visit (AMBULATORY_SURGERY_CENTER): Payer: Self-pay | Admitting: *Deleted

## 2018-03-19 ENCOUNTER — Telehealth: Payer: Self-pay

## 2018-03-19 ENCOUNTER — Other Ambulatory Visit: Payer: Self-pay

## 2018-03-19 VITALS — Ht 70.0 in | Wt 180.4 lb

## 2018-03-19 DIAGNOSIS — R1011 Right upper quadrant pain: Secondary | ICD-10-CM

## 2018-03-19 NOTE — Progress Notes (Signed)
No egg or soy allergy known to patient  No issues with past sedation with any surgeries  or procedures, no intubation problems  No diet pills per patient No home 02 use per patient  No blood thinners per patient  Pt denies issues with constipation  No A fib or A flutter  EMMI video sent to pt's e mail pt. Declined has had several times

## 2018-03-19 NOTE — Telephone Encounter (Signed)
Pt has appt with [facility] - Dr. Johney Maine, on 03-30-18 at 9:30am. Pt notified.

## 2018-03-30 ENCOUNTER — Ambulatory Visit: Payer: Self-pay | Admitting: Surgery

## 2018-03-30 HISTORY — PX: UPPER GASTROINTESTINAL ENDOSCOPY: SHX188
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
## 2018-04-02 ENCOUNTER — Ambulatory Visit (AMBULATORY_SURGERY_CENTER): Payer: Medicare Other | Admitting: Gastroenterology

## 2018-04-02 ENCOUNTER — Other Ambulatory Visit: Payer: Self-pay

## 2018-04-02 ENCOUNTER — Encounter: Payer: Self-pay | Admitting: Gastroenterology

## 2018-04-02 VITALS — BP 110/74 | HR 59 | Temp 98.7°F | Resp 20

## 2018-04-02 DIAGNOSIS — R1011 Right upper quadrant pain: Secondary | ICD-10-CM | POA: Diagnosis present

## 2018-04-02 DIAGNOSIS — K219 Gastro-esophageal reflux disease without esophagitis: Secondary | ICD-10-CM

## 2018-04-02 DIAGNOSIS — K317 Polyp of stomach and duodenum: Secondary | ICD-10-CM

## 2018-04-02 DIAGNOSIS — K319 Disease of stomach and duodenum, unspecified: Secondary | ICD-10-CM | POA: Diagnosis not present

## 2018-04-02 DIAGNOSIS — K3189 Other diseases of stomach and duodenum: Secondary | ICD-10-CM | POA: Diagnosis not present

## 2018-04-02 MED ORDER — SUCRALFATE 1 GM/10ML PO SUSP: 1.0000 g | Freq: Four times a day (QID) | ORAL | 1 refills | Status: DC | PRN

## 2018-04-02 MED ORDER — SODIUM CHLORIDE 0.9 % IV SOLN: 500.0000 mL | Freq: Once | INTRAVENOUS | Status: DC

## 2018-04-02 NOTE — Progress Notes (Signed)
Called to room to assist during endoscopic procedure.  Patient ID and intended procedure confirmed with present staff. Received instructions for my participation in the procedure from the performing physician.  

## 2018-04-02 NOTE — Progress Notes (Signed)
Report given to PACU, vss 

## 2018-04-02 NOTE — Op Note (Signed)
[facility] Patient Name: Ronald Mccormick Procedure Date: 04/02/2018 1:56 PM MRN: [id number] Endoscopist: Remo Lipps P. Armbruster MD, MD Age: 72 Referring MD:  Date of Birth: 04-Apr-1946 Gender: Male Account #: [id number] Procedure:                Upper GI endoscopy Indications:              history of reflux, family history of esophageal                            cancer, on Dexilant Medicines:                Monitored Anesthesia Care Procedure:                Pre-Anesthesia Assessment:                           - Prior to the procedure, a History and Physical                            was performed, and patient medications and                            allergies were reviewed. The patient's tolerance of                            previous anesthesia was also reviewed. The risks                            and benefits of the procedure and the sedation                            options and risks were discussed with the patient.                            All questions were answered, and informed consent                            was obtained. Prior Anticoagulants: The patient has                            taken no previous anticoagulant or antiplatelet                            agents. ASA Grade Assessment: III - A patient with                            severe systemic disease. After reviewing the risks                            and benefits, the patient was deemed in                            satisfactory condition to undergo the procedure.  After obtaining informed consent, the endoscope was                            passed under direct vision. Throughout the                            procedure, the patient's blood pressure, pulse, and                            oxygen saturations were monitored continuously. The                            Model GIF-[id number]) scope was introduced                            through the mouth, and  advanced to the second part                            of duodenum. The upper GI endoscopy was                            accomplished without difficulty. The patient                            tolerated the procedure well. Scope In: Scope Out: Findings:                 Esophagogastric landmarks were identified: the                            Z-line was found at 35 cm, the gastroesophageal                            junction was found at 35 cm and the upper extent of                            the gastric folds was found at 40 cm from the                            incisors.                           A 5 cm hiatal hernia was present.                           LA Grade B esophagitis was found at the GEJ.                           The Z-line was irregular with a 1cm tongue of                            salmon colored mucosa without nodularity and was  found 34 cm from the incisors. Biopsies were taken                            with a cold forceps for histology.                           The exam of the esophagus was otherwise normal.                           Multiple large sessile polyps were found in the                            gastric fundus and in the gastric body (largest                            75mm-20mm in size). Biopsies were taken with a cold                            forceps for histology.                           The exam of the stomach was otherwise normal.                           Localized nodular mucosa was found in the duodenal                            bulb consistent with benign ectopic gastric mucosa.                            Biopsies were taken with a cold forceps for                            histology.                           The exam of the duodenum was otherwise normal. Complications:            No immediate complications. Estimated blood loss:                            Minimal. Estimated Blood Loss:     Estimated blood  loss was minimal. Impression:               - Esophagogastric landmarks identified.                           - 5 cm hiatal hernia.                           - LA Grade B reflux esophagitis.                           - Z-line irregular, 34 cm from the incisors.  Biopsied.                           - Multiple benign appearing large gastric polyps,                            some inflamed, consistent with benign fundic gland                            polyps. Biopsied.                           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 Biopsied.                           - Normal duodenum otherwise Recommendation:           - Patient has a contact number available for                            emergencies. The signs and symptoms of potential                            delayed complications were discussed with the                            patient. Return to normal activities tomorrow.                            Written discharge instructions were provided to the                            patient.                           - Resume previous diet.                           - Continue present medications.                           - Increase Dexilant back to 60mg  / day (if had                            decreased to 30mg )                           - Add liquid carafate 10cc PO q 6 hrs PRN                           - Await pathology results.                           - Consideration for removal of gastric polyps at  the hospital given size of some of them [place]. Armbruster MD, MD 04/02/2018 2:24:11 PM This report has been signed electronically.

## 2018-04-02 NOTE — Patient Instructions (Signed)
HANDOUTS GIVEN FOR ESOPHAGITIS AND HIATAL HERNIA.  TAKE ZANTAC 150MG  EVERY NIGHT BEFORE BEDTIME.  START CARAFATE 10CC EVERY 6 HRS AS NEEDED.  YOU HAD AN ENDOSCOPIC PROCEDURE TODAY AT [facility]:   Refer to the procedure report that was given to you for any specific questions about what was found during the examination.  If the procedure report does not answer your questions, please call your gastroenterologist to clarify.  If you requested that your care partner not be given the details of your procedure findings, then the procedure report has been included in a sealed envelope for you to review at your convenience later.  YOU SHOULD EXPECT: Some feelings of bloating in the abdomen. Passage of more gas than usual.  Walking can help get rid of the air that was put into your GI tract during the procedure and reduce the bloating. If you had a lower endoscopy (such as a colonoscopy or flexible sigmoidoscopy) you may notice spotting of blood in your stool or on the toilet paper. If you underwent a bowel prep for your procedure, you may not have a normal bowel movement for a few days.  Please Note:  You might notice some irritation and congestion in your nose or some drainage.  This is from the oxygen used during your procedure.  There is no need for concern and it should clear up in a day or so.  SYMPTOMS TO REPORT IMMEDIATELY:   Following upper endoscopy (EGD)  Vomiting of blood or coffee ground material  New chest pain or pain under the shoulder blades  Painful or persistently difficult swallowing  New shortness of breath  Fever of 100F or higher  Black, tarry-looking stools  For urgent or emergent issues, a gastroenterologist can be reached at any hour by calling [phone].   DIET:  We do recommend a small meal at first, but then you may proceed to your regular diet.  Drink plenty of fluids but you should avoid alcoholic beverages for 24 hours.  ACTIVITY:  You should  plan to take it easy for the rest of today and you should NOT DRIVE or use heavy machinery until tomorrow (because of the sedation medicines used during the test).    FOLLOW UP: Our staff will call the number listed on your records the next business day following your procedure to check on you and address any questions or concerns that you may have regarding the information given to you following your procedure. If we do not reach you, we will leave a message.  However, if you are feeling well and you are not experiencing any problems, there is no need to return our call.  We will assume that you have returned to your regular daily activities without incident.  If any biopsies were taken you will be contacted by phone or by letter within the next 1-3 weeks.  Please call us at [phone] if you have not heard about the biopsies in 3 weeks.    SIGNATURES/CONFIDENTIALITY: You and/or your care partner have signed paperwork which will be entered into your electronic medical record.  These signatures attest to the fact that that the information above on your After Visit Summary has been reviewed and is understood.  Full responsibility of the confidentiality of this discharge information lies with you and/or your care-partner.

## 2018-04-03 ENCOUNTER — Telehealth: Payer: Self-pay

## 2018-04-03 NOTE — Telephone Encounter (Signed)
  Follow up Call-  Call back number 04/02/2018  Post procedure Call Back phone  # [phone]  Permission to leave phone message Yes  Some recent data might be hidden     Patient questions:  Do you have a fever, pain , or abdominal swelling? No. Pain Score  0 *  Have you tolerated food without any problems? Yes.    Have you been able to return to your normal activities? Yes.    Do you have any questions about your discharge instructions: Diet   No. Medications  No. Follow up visit  No.  Do you have questions or concerns about your Care? No.  Actions: * If pain score is 4 or above: No action needed, pain <4.

## 2018-04-06 ENCOUNTER — Other Ambulatory Visit (HOSPITAL_COMMUNITY): Payer: Self-pay | Admitting: Surgery

## 2018-04-06 DIAGNOSIS — K449 Diaphragmatic hernia without obstruction or gangrene: Secondary | ICD-10-CM

## 2018-04-07 ENCOUNTER — Telehealth: Payer: Self-pay | Admitting: Gastroenterology

## 2018-04-07 NOTE — Telephone Encounter (Signed)
Contacted patient and scheduled him for 04/10/18 arrive at 12:00 noon for a 12:30 appointment. Fast for 4 hours prior to the appointment.

## 2018-04-10 ENCOUNTER — Encounter (HOSPITAL_COMMUNITY)
Admission: RE | Disposition: A | Discharge status: Home / Self Care | Payer: Self-pay | Source: Ambulatory Visit | Attending: Gastroenterology

## 2018-04-10 ENCOUNTER — Ambulatory Visit (HOSPITAL_COMMUNITY)
Admission: RE | Admit: 2018-04-10 | Discharge: 2018-04-10 | Disposition: A | Discharge status: Home / Self Care | Payer: Medicare Other | Source: Ambulatory Visit | Attending: Gastroenterology | Admitting: Gastroenterology

## 2018-04-10 DIAGNOSIS — K228 Other specified diseases of esophagus: Secondary | ICD-10-CM | POA: Diagnosis not present

## 2018-04-10 DIAGNOSIS — K219 Gastro-esophageal reflux disease without esophagitis: Secondary | ICD-10-CM | POA: Diagnosis not present

## 2018-04-10 DIAGNOSIS — K449 Diaphragmatic hernia without obstruction or gangrene: Secondary | ICD-10-CM | POA: Diagnosis not present

## 2018-04-10 DIAGNOSIS — R12 Heartburn: Secondary | ICD-10-CM | POA: Diagnosis not present

## 2018-04-10 HISTORY — PX: ESOPHAGEAL MANOMETRY: SHX5429

## 2018-04-10 SURGERY — MANOMETRY, ESOPHAGUS

## 2018-04-10 MED ORDER — LIDOCAINE VISCOUS 2 % MT SOLN
OROMUCOSAL | Status: AC
  Filled 2018-04-10: qty 15

## 2018-04-10 SURGICAL SUPPLY — 2 items
FACESHIELD LNG OPTICON STERILE (SAFETY) IMPLANT
GLOVE BIO SURGEON STRL SZ8 (GLOVE) ×6 IMPLANT

## 2018-04-10 NOTE — Progress Notes (Signed)
Esophageal Manometry done per protocol. Pt tolerated well. Small amt of bleeding at nares from irritation, but resolved when probe was removed. No complications noted. Report to be read by Dr. Silverio Decamp

## 2018-04-11 ENCOUNTER — Encounter (HOSPITAL_COMMUNITY): Payer: Self-pay | Admitting: Gastroenterology

## 2018-04-15 ENCOUNTER — Ambulatory Visit (HOSPITAL_COMMUNITY)
Admission: RE | Admit: 2018-04-15 | Discharge: 2018-04-15 | Disposition: A | Discharge status: Home / Self Care | Payer: Medicare Other | Source: Ambulatory Visit | Attending: Surgery | Admitting: Surgery

## 2018-04-15 DIAGNOSIS — K449 Diaphragmatic hernia without obstruction or gangrene: Secondary | ICD-10-CM

## 2018-04-15 DIAGNOSIS — R1011 Right upper quadrant pain: Secondary | ICD-10-CM | POA: Diagnosis present

## 2018-04-15 MED ORDER — TECHNETIUM TC 99M SULFUR COLLOID
2.0000 | Freq: Once | INTRAVENOUS | Status: AC | PRN
  Administered 2018-04-15: 2 via ORAL

## 2018-04-16 DIAGNOSIS — K449 Diaphragmatic hernia without obstruction or gangrene: Secondary | ICD-10-CM

## 2018-04-27 ENCOUNTER — Telehealth: Payer: Self-pay | Admitting: Gastroenterology

## 2018-04-27 NOTE — Telephone Encounter (Signed)
FYI, patient is having his gallbladder removed on 5/15. He is scheduled for EGD with removal of large gastric polyps at [facility] on 06/16/18.

## 2018-04-27 NOTE — Telephone Encounter (Signed)
Okay thanks for letting me know. I relayed results of manometry to Dr. Johney Maine - patient is higher risk for dysphagia given poor motility noted. He may still do hiatal hernia repair with a loose wrap and will discuss this with the patient further. I can do EGD at the hospital after his surgery in May. Thanks

## 2018-05-04 ENCOUNTER — Ambulatory Visit: Payer: Self-pay | Admitting: Surgery

## 2018-05-04 NOTE — Patient Instructions (Signed)
Ronald Mccormick  05/04/2018   Your procedure is scheduled on:   05-13-2018  Report to [facility] Main  Entrance  Report to admitting at   11:00 AM    Call this number if you have problems the morning of surgery [phone]   Remember: Do not eat food After Midnight with Exception clear liquid diet until 10:00 AM  Then nothing by mouth after 10:00 AM    Take these medicines the morning of surgery with A SIP OF WATER:   Dexilant                                You may not have any metal on your body including hair pins and              piercings  Do not wear jewelry, make-up, lotions, powders or perfumes, deodorant                          Men may shave face and neck.   Do not bring valuables to the hospital. [place].  Contacts, dentures or bridgework may not be worn into surgery.  Leave suitcase in the car. After surgery it may be brought to your room.     Special Instructions: N/A              Please read over the following fact sheets you were given: _____________________________________________________________________             NO SOLID FOOD AFTER MIDNIGHT THE NIGHT PRIOR TO SURGERY. NOTHING BY MOUTH EXCEPT CLEAR LIQUIDS UNTIL 3 HOURS PRIOR TO [place] SURGERY. PLEASE FINISH ENSURE DRINK PER SURGEON ORDER 3 HOURS PRIOR TO SCHEDULED SURGERY TIME WHICH NEEDS TO BE COMPLETED AT ___10:00 AM_________.   CLEAR LIQUID DIET   Foods Allowed                                                                     Foods Excluded  Coffee and tea, regular and decaf                             liquids that you cannot  Plain Jell-O in any flavor                                             see through such as: Fruit ices (not with fruit pulp)                                     milk, soups, orange juice  Iced Popsicles  All solid food Carbonated beverages, regular and diet                                     Cranberry, grape and apple juices Sports drinks like Gatorade Lightly seasoned clear broth or consume(fat free) Sugar, honey syrup  Sample Menu Breakfast                                Lunch                                     Supper Cranberry juice                    Beef broth                            Chicken broth Jell-O                                     Grape juice                           Apple juice Coffee or tea                        Jell-O                                      Popsicle                                                Coffee or tea                        Coffee or tea  _____________________________________________________________________  [facility] - Preparing for Surgery Before surgery, you can play an important role.  Because skin is not sterile, your skin needs to be as free of germs as possible.  You can reduce the number of germs on your skin by washing with CHG (chlorahexidine gluconate) soap before surgery.  CHG is an antiseptic cleaner which kills germs and bonds with the skin to continue killing germs even after washing. Please DO NOT use if you have an allergy to CHG or antibacterial soaps.  If your skin becomes reddened/irritated stop using the CHG and inform your nurse when you arrive at Short Stay. Do not shave (including legs and underarms) for at least 48 hours prior to the first CHG shower.  You may shave your face/neck. Please follow these instructions carefully:  1.  Shower with CHG Soap the night before surgery and the  morning of Surgery.  2.  If you choose to wash your hair, wash your hair first as usual with your  normal  shampoo.  3.  After you shampoo, rinse your hair and body thoroughly to remove the  shampoo.  4.  Use CHG as you would any other liquid soap.  You can apply chg directly  to the skin and wash                       Gently with a scrungie or clean washcloth.  5.  Apply the  CHG Soap to your body ONLY FROM THE NECK DOWN.   Do not use on face/ open                           Wound or open sores. Avoid contact with eyes, ears mouth and genitals (private parts).                       Wash face,  Genitals (private parts) with your normal soap.             6.  Wash thoroughly, paying special attention to the area where your surgery  will be performed.  7.  Thoroughly rinse your body with warm water from the neck down.  8.  DO NOT shower/wash with your normal soap after using and rinsing off  the CHG Soap.                9.  Pat yourself dry with a clean towel.            10.  Wear clean pajamas.            11.  Place clean sheets on your bed the night of your first shower and do not  sleep with pets. Day of Surgery : Do not apply any lotions/deodorants the morning of surgery.  Please wear clean clothes to the hospital/surgery center.  FAILURE TO FOLLOW THESE INSTRUCTIONS MAY RESULT IN THE CANCELLATION OF YOUR SURGERY PATIENT SIGNATURE_________________________________  NURSE SIGNATURE__________________________________  ________________________________________________________________________

## 2018-05-05 ENCOUNTER — Encounter (HOSPITAL_COMMUNITY)
Admission: RE | Admit: 2018-05-05 | Discharge: 2018-05-05 | Disposition: A | Discharge status: Home / Self Care | Payer: Medicare Other | Source: Ambulatory Visit | Attending: Surgery | Admitting: Surgery

## 2018-05-05 ENCOUNTER — Other Ambulatory Visit: Payer: Self-pay

## 2018-05-05 ENCOUNTER — Encounter (HOSPITAL_COMMUNITY): Payer: Self-pay

## 2018-05-05 DIAGNOSIS — Z01818 Encounter for other preprocedural examination: Secondary | ICD-10-CM | POA: Diagnosis not present

## 2018-05-05 DIAGNOSIS — K449 Diaphragmatic hernia without obstruction or gangrene: Secondary | ICD-10-CM | POA: Insufficient documentation

## 2018-05-05 HISTORY — DX: Personal history of colon polyps, unspecified: Z86.0100

## 2018-05-05 HISTORY — DX: Chronic cholecystitis: K81.1

## 2018-05-05 HISTORY — DX: Gout, unspecified: M10.9

## 2018-05-05 HISTORY — DX: Personal history of colonic polyps: Z86.010

## 2018-05-05 HISTORY — DX: Diaphragmatic hernia without obstruction or gangrene: K44.9

## 2018-05-05 HISTORY — DX: Unspecified hearing loss, unspecified ear: H91.90

## 2018-05-05 LAB — CBC
HEMATOCRIT: 49.2 % (ref 39.0–52.0)
HEMOGLOBIN: 16.6 g/dL (ref 13.0–17.0)
MCH: 30.2 pg (ref 26.0–34.0)
MCHC: 33.7 g/dL (ref 30.0–36.0)
MCV: 89.6 fL (ref 78.0–100.0)
Platelets: 274 10*3/uL (ref 150–400)
RBC: 5.49 MIL/uL (ref 4.22–5.81)
RDW: 13.3 % (ref 11.5–15.5)
WBC: 16.4 10*3/uL — AB (ref 4.0–10.5)

## 2018-05-05 LAB — BASIC METABOLIC PANEL
ANION GAP: 10 (ref 5–15)
BUN: 21 mg/dL — ABNORMAL HIGH (ref 6–20)
CO2: 24 mmol/L (ref 22–32)
Calcium: 9.6 mg/dL (ref 8.9–10.3)
Chloride: 106 mmol/L (ref 101–111)
Creatinine, Ser: 1.37 mg/dL — ABNORMAL HIGH (ref 0.61–1.24)
GFR calc non Af Amer: 51 mL/min — ABNORMAL LOW (ref >60)
GFR, EST AFRICAN AMERICAN: 59 mL/min — AB (ref >60)
GLUCOSE: 95 mg/dL (ref 65–99)
POTASSIUM: 4.7 mmol/L (ref 3.5–5.1)
Sodium: 140 mmol/L (ref 135–145)

## 2018-05-05 LAB — ABO/RH: ABO/RH(D): O POS

## 2018-05-05 MED ORDER — CHLORHEXIDINE GLUCONATE CLOTH 2 % EX PADS
6.0000 | MEDICATED_PAD | Freq: Once | CUTANEOUS | Status: AC
  Administered 2018-05-13: 6 via TOPICAL

## 2018-05-05 NOTE — Progress Notes (Addendum)
CBC result dated 05-05-2018 sent to dr gross in epic.  EKG dated 02-21-2018 in epic.   Pt will need to sign consent day of surgery. Dr Johney Maine informed consent order does not state procedure via robot assisted for gallbladder removal and hiatal hernia repair. So posted case and consent order do not match.  Also, at pt PAT appointment this morning pt stated he was only getting gallbladder out and not having hiatal hernia repair was his understanding.  Called [facility] [place] [facility], spoke w/ Armen (triage nurse) informed her of the issue.  She stated she would let Dr Johney Maine assistant know and assistant will get in touch with Dr Johney Maine to resolved issue.

## 2018-05-06 ENCOUNTER — Telehealth: Payer: Self-pay | Admitting: Surgery

## 2018-05-06 NOTE — Telephone Encounter (Signed)
Ronald Mccormick  1946/09/21 [id number]  Patient Care Team: Christain Sacramento, MD as PCP - General (Family Medicine) Michael Boston, MD as Consulting Physician (General Surgery) Armbruster, Carlota Raspberry, MD as Consulting Physician (Gastroenterology)  This patient is a 72 y.o.male who calls today for surgical evaluation.   Reason for call: Clarification of surgical plan  Called patient and again went over recommendations for surgery.  Still plan cholecystectomy for biliary colic and chronic cholecystitis.  Because of his interest in a hiatal hernia repair, we went ahead and proceeded with manometry.  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  Not a great candidate for complete fundoplication, but reasonable to do a partial posterior Toupet fundoplication wrap.  I called discussed with the patient.  He still has some occasional dysphasia, heartburn, and reflux; and. he ideally would like to get the hiatal hernia repair fixwd.  He was confused about whether was okay to do a fundoplication based on the manometry results after discussing with gastroenterology.  I think it is reasonable to reduce and primarily repair the hiatal hernia with possible absorbable mesh reinforcement.  Some type of fundoplication would allow better heartburn and reflux control.  I would not do a complete fundoplication based on his esophageal dysmotility.  Would plan a Toupet partial 180-240deg posterior fundoplication as is done with Heller myotomy for achalasia patients that have esophageal aperistalsis.  That is usually a reasonable compromise.  I again discussed the reasoning for liquid and pured diet for the first several weeks especially and then a gradual transition back to a normal regular solid diet over the next month or so after surgery.  Another option is just to continue his antacid medications and ride this out.  The concern would be is that usually hiatal hernias get larger over time and become more symptomatic.  He is  already symptomatic now.  He would like to try to get the hiatal hernia repair in the hopes of coming off of antacid medication as well as get his heartburn & dysphagia under better control.  Patient does have numerous gastric polyps.  There was discussion about doing a endoscopic polypectomy of the larger gastric polyps.  Tentatively planned for next month.  I will see if gastroenterology could do at the same time of surgery if they wish.  Patient Active Problem List   Diagnosis Date Noted  . Hiatal hernia   . ACS (acute coronary syndrome) ([place]) 05/21/2014  . Family history of malignant neoplasm of gastrointestinal tract 09/22/2013  . Personal history of colonic polyps 09/22/2013  . HYPERLIPIDEMIA 05/27/2007  . GERD 05/27/2007    Past Medical History:  Diagnosis Date  . Chronic cholecystitis   . GERD (gastroesophageal reflux disease)   . Gouty arthritis of right great toe    finished prednisone treatment 05-05-2018--  per pt resolved  . Hiatal hernia   . History of colon polyps   . HOH (hard of hearing)     Past Surgical History:  Procedure Laterality Date  . COLONOSCOPY  08/2013  . ESOPHAGEAL MANOMETRY N/A 04/10/2018   Procedure: ESOPHAGEAL MANOMETRY (EM);  Surgeon: Mauri Pole, MD;  Location: [facility];  Service: Endoscopy;  Laterality: N/A;  . LEFT HEART CATHETERIZATION WITH CORONARY/GRAFT ANGIOGRAM N/A 05/24/2014   Procedure: LEFT HEART CATHETERIZATION WITH Beatrix Fetters;  Surgeon: Birdie Riddle, MD;  Location: [facility]ATH [facility];  Service: Cardiovascular;  Laterality: N/A; minimal luminal irreglarities involving pLAD and LCFx, otherwise normal coronary arteries, normal LVSF (ef 60-65%)and LVEDP (49mmHg)  .  STAPEDES SURGERY Right 1986  . TONSILLECTOMY  1954  . UPPER GASTROINTESTINAL ENDOSCOPY  03/2018    Social History   Socioeconomic History  . Marital status: Married    Spouse name: Not on file  . Number of children: 3  . Years of education: Not on file   . Highest education level: Not on file  Occupational History  . Occupation: retired    Fish farm manager: [employer]  Social Needs  . Financial resource strain: Not on file  . Food insecurity:    Worry: Not on file    Inability: Not on file  . Transportation needs:    Medical: Not on file    Non-medical: Not on file  Tobacco Use  . Smoking status: Former Smoker    Years: 3.00    Types: Pipe    Start date: 12/30/1978    Last attempt to quit: 05/05/1984    Years since quitting: 34.0  . Smokeless tobacco: Never Used  Substance and Sexual Activity  . Alcohol use: Yes    Alcohol/week: 2.4 oz    Types: 4 Cans of beer per week  . Drug use: No  . Sexual activity: Not on file  Lifestyle  . Physical activity:    Days per week: Not on file    Minutes per session: Not on file  . Stress: Not on file  Relationships  . Social connections:    Talks on phone: Not on file    Gets together: Not on file    Attends religious service: Not on file    Active member of club or organization: Not on file    Attends meetings of clubs or organizations: Not on file    Relationship status: Not on file  . Intimate partner violence:    Fear of current or ex partner: Not on file    Emotionally abused: Not on file    Physically abused: Not on file    Forced sexual activity: Not on file  Other Topics Concern  . Not on file  Social History Narrative  . Not on file    Family History  Problem Relation Age of Onset  . Colon cancer Father 96  . Esophageal cancer Father   . Colon polyps Father   . Hypertension Mother   . Stroke Mother   . Cancer Maternal Grandfather        mets  . Rectal cancer Neg Hx   . Stomach cancer Neg Hx     Current Outpatient Medications  Medication Sig Dispense Refill  . B Complex-C (SUPER B COMPLEX PO) Take 1 tablet by mouth daily.    Marland Kitchen DEXILANT 60 MG capsule TAKE 1 CAPSULE DAILY (Patient taking differently: TAKE 1 CAPSULE DAILY--- takes in am) 90 capsule 2  . Multiple Vitamin  (MULTIVITAMIN) tablet Take 1 tablet by mouth daily.     No current facility-administered medications for this visit.    Facility-Administered Medications Ordered in Other Visits  Medication Dose Route Frequency Provider Last Rate Last Dose  . Chlorhexidine Gluconate Cloth 2 % PADS 6 each  6 each Topical Once Michael Boston, MD       And  . Chlorhexidine Gluconate Cloth 2 % PADS 6 each  6 each Topical Once Michael Boston, MD         No Known Allergies  @VS @  Nm Gastric Emptying  Result Date: 04/15/2018 CLINICAL DATA:  History of reflux and hiatal hernia EXAM: NUCLEAR MEDICINE GASTRIC EMPTYING SCAN TECHNIQUE: After oral ingestion  of radiolabeled meal, sequential abdominal images were obtained for 4 hours. Percentage of activity emptying the stomach was calculated at 1 hour, 2 hour, 3 hour, and 4 hours. RADIOPHARMACEUTICALS:  1.9 mCi Tc-7m sulfur colloid in standardized meal COMPARISON:  None. FINDINGS: Expected location of the stomach in the left upper quadrant. Ingested meal empties the stomach gradually over the course of the study. 28% emptied at 1 hr ( normal >= 10%) 60% emptied at 2 hr ( normal >= 40%) 83% emptied at 3 hr ( normal >= 70%) 95% emptied at 4 hr ( normal >= 90%) IMPRESSION: Normal gastric emptying study. Electronically Signed   By: Inez Catalina M.D.   On: 04/15/2018 16:37    Note: This dictation was prepared with Dragon/digital dictation along with [employer]. Any transcriptional errors that result from this process are unintentional.   .[name], M.D., F.A.C.S. Gastrointestinal and Minimally Invasive Surgery [facility] [address], [place], [place] [address] [phone] Main / Paging  05/06/2018 11:42 AM

## 2018-05-11 NOTE — Progress Notes (Signed)
Dr Johney Maine has progress note in epic dated 05-06-2018 and spoke w/ pt about procedure and dr gross has inform consent order in epic.

## 2018-05-13 ENCOUNTER — Inpatient Hospital Stay (HOSPITAL_COMMUNITY): Payer: Medicare Other | Admitting: Registered Nurse

## 2018-05-13 ENCOUNTER — Encounter (HOSPITAL_COMMUNITY)
Admission: RE | Disposition: A | Discharge status: Home / Self Care | Payer: Self-pay | Source: Ambulatory Visit | Attending: Surgery

## 2018-05-13 ENCOUNTER — Encounter (HOSPITAL_COMMUNITY): Payer: Self-pay | Admitting: *Deleted

## 2018-05-13 ENCOUNTER — Observation Stay (HOSPITAL_COMMUNITY)
Admission: RE | Admit: 2018-05-13 | Discharge: 2018-05-14 | Disposition: A | Discharge status: Home / Self Care | Payer: Medicare Other | Source: Ambulatory Visit | Attending: Surgery | Admitting: Surgery

## 2018-05-13 DIAGNOSIS — K8010 Calculus of gallbladder with chronic cholecystitis without obstruction: Secondary | ICD-10-CM

## 2018-05-13 DIAGNOSIS — Z87891 Personal history of nicotine dependence: Secondary | ICD-10-CM | POA: Insufficient documentation

## 2018-05-13 DIAGNOSIS — Z800 Family history of malignant neoplasm of digestive organs: Secondary | ICD-10-CM | POA: Diagnosis not present

## 2018-05-13 DIAGNOSIS — K8044 Calculus of bile duct with chronic cholecystitis without obstruction: Principal | ICD-10-CM | POA: Insufficient documentation

## 2018-05-13 DIAGNOSIS — Z79899 Other long term (current) drug therapy: Secondary | ICD-10-CM | POA: Insufficient documentation

## 2018-05-13 DIAGNOSIS — K219 Gastro-esophageal reflux disease without esophagitis: Secondary | ICD-10-CM | POA: Diagnosis not present

## 2018-05-13 DIAGNOSIS — K449 Diaphragmatic hernia without obstruction or gangrene: Secondary | ICD-10-CM | POA: Diagnosis not present

## 2018-05-13 HISTORY — PX: LAPAROSCOPIC NISSEN FUNDOPLICATION: SHX1932

## 2018-05-13 HISTORY — PX: ROBOTIC ASSISTED LAPAROSCOPIC CHOLECYSTECTOMY: SHX6521

## 2018-05-13 LAB — TYPE AND SCREEN
ABO/RH(D): O POS
ANTIBODY SCREEN: NEGATIVE

## 2018-05-13 SURGERY — FUNDOPLICATION, NISSEN, ROBOT-ASSISTED, LAPAROSCOPIC
Anesthesia: General | Site: Abdomen

## 2018-05-13 MED ORDER — MIDAZOLAM HCL 2 MG/2ML IJ SOLN
INTRAMUSCULAR | Status: AC
  Filled 2018-05-13: qty 2

## 2018-05-13 MED ORDER — ONDANSETRON HCL 4 MG/2ML IJ SOLN
INTRAMUSCULAR | Status: DC | PRN
  Administered 2018-05-13: 4 mg via INTRAVENOUS

## 2018-05-13 MED ORDER — LACTATED RINGERS IV BOLUS: 1000.0000 mL | Freq: Three times a day (TID) | INTRAVENOUS | Status: DC | PRN

## 2018-05-13 MED ORDER — PROMETHAZINE HCL 25 MG RE SUPP: 25.0000 mg | Freq: Four times a day (QID) | RECTAL | 10 refills | Status: DC | PRN

## 2018-05-13 MED ORDER — METOPROLOL TARTRATE 5 MG/5ML IV SOLN: 5.0000 mg | Freq: Four times a day (QID) | INTRAVENOUS | Status: DC | PRN

## 2018-05-13 MED ORDER — PANTOPRAZOLE SODIUM 40 MG PO TBEC
40.0000 mg | DELAYED_RELEASE_TABLET | Freq: Every day | ORAL | Status: DC
  Administered 2018-05-14: 40 mg via ORAL
  Filled 2018-05-13: qty 1

## 2018-05-13 MED ORDER — LIP MEDEX EX OINT
1.0000 | TOPICAL_OINTMENT | Freq: Two times a day (BID) | CUTANEOUS | Status: DC
Start: 2018-05-13 — End: 2018-05-14
  Administered 2018-05-13 – 2018-05-14 (×2): 1 via TOPICAL
  Filled 2018-05-13: qty 7

## 2018-05-13 MED ORDER — PROCHLORPERAZINE MALEATE 10 MG PO TABS: 10.0000 mg | ORAL_TABLET | Freq: Four times a day (QID) | ORAL | Status: DC | PRN

## 2018-05-13 MED ORDER — HYDROMORPHONE HCL 1 MG/ML IJ SOLN
INTRAMUSCULAR | Status: AC
  Filled 2018-05-13: qty 1

## 2018-05-13 MED ORDER — KETAMINE HCL 10 MG/ML IJ SOLN
INTRAMUSCULAR | Status: AC
  Filled 2018-05-13: qty 1

## 2018-05-13 MED ORDER — FENTANYL CITRATE (PF) 250 MCG/5ML IJ SOLN
INTRAMUSCULAR | Status: AC
  Filled 2018-05-13: qty 5

## 2018-05-13 MED ORDER — METOCLOPRAMIDE HCL 5 MG/ML IJ SOLN: 10.0000 mg | Freq: Four times a day (QID) | INTRAMUSCULAR | Status: DC | PRN

## 2018-05-13 MED ORDER — ENSURE SURGERY PO LIQD
237.0000 mL | Freq: Two times a day (BID) | ORAL | Status: DC
  Administered 2018-05-13 – 2018-05-14 (×2): 237 mL via ORAL
  Filled 2018-05-13 (×3): qty 237

## 2018-05-13 MED ORDER — OXYCODONE HCL 5 MG PO TABS: 5.0000 mg | ORAL_TABLET | Freq: Once | ORAL | Status: DC | PRN

## 2018-05-13 MED ORDER — SODIUM CHLORIDE 0.9 % IV SOLN: 250.0000 mL | INTRAVENOUS | Status: DC | PRN

## 2018-05-13 MED ORDER — 0.9 % SODIUM CHLORIDE (POUR BTL) OPTIME
TOPICAL | Status: DC | PRN
  Administered 2018-05-13: 2000 mL

## 2018-05-13 MED ORDER — DIPHENHYDRAMINE HCL 12.5 MG/5ML PO ELIX: 12.5000 mg | ORAL_SOLUTION | Freq: Four times a day (QID) | ORAL | Status: DC | PRN

## 2018-05-13 MED ORDER — METRONIDAZOLE IN NACL 5-0.79 MG/ML-% IV SOLN
500.0000 mg | Freq: Once | INTRAVENOUS | Status: AC
  Administered 2018-05-13: 500 mg via INTRAVENOUS
  Filled 2018-05-13: qty 100

## 2018-05-13 MED ORDER — LIDOCAINE 2% (20 MG/ML) 5 ML SYRINGE
INTRAMUSCULAR | Status: DC | PRN
  Administered 2018-05-13: 100 mg via INTRAVENOUS

## 2018-05-13 MED ORDER — OXYCODONE HCL 5 MG PO TABS: 5.0000 mg | ORAL_TABLET | Freq: Four times a day (QID) | ORAL | 0 refills | Status: DC | PRN

## 2018-05-13 MED ORDER — ONDANSETRON HCL 4 MG/2ML IJ SOLN
INTRAMUSCULAR | Status: AC
  Filled 2018-05-13: qty 2

## 2018-05-13 MED ORDER — MAGIC MOUTHWASH
15.0000 mL | Freq: Four times a day (QID) | ORAL | Status: DC | PRN
  Filled 2018-05-13: qty 15

## 2018-05-13 MED ORDER — KETAMINE HCL 10 MG/ML IJ SOLN
INTRAMUSCULAR | Status: DC | PRN
  Administered 2018-05-13: 25 mg via INTRAVENOUS

## 2018-05-13 MED ORDER — ACETAMINOPHEN 500 MG PO TABS
1000.0000 mg | ORAL_TABLET | ORAL | Status: AC
  Administered 2018-05-13: 1000 mg via ORAL
  Filled 2018-05-13: qty 2

## 2018-05-13 MED ORDER — BISACODYL 10 MG RE SUPP: 10.0000 mg | Freq: Every day | RECTAL | Status: DC | PRN

## 2018-05-13 MED ORDER — LACTATED RINGERS IR SOLN
Status: DC | PRN
  Administered 2018-05-13: 1000 mL

## 2018-05-13 MED ORDER — PHENOL 1.4 % MT LIQD: 1.0000 | OROMUCOSAL | Status: DC | PRN

## 2018-05-13 MED ORDER — MEPERIDINE HCL 50 MG/ML IJ SOLN: 6.2500 mg | INTRAMUSCULAR | Status: DC | PRN

## 2018-05-13 MED ORDER — SUGAMMADEX SODIUM 200 MG/2ML IV SOLN
INTRAVENOUS | Status: AC
  Filled 2018-05-13: qty 2

## 2018-05-13 MED ORDER — ROCURONIUM BROMIDE 10 MG/ML (PF) SYRINGE
PREFILLED_SYRINGE | INTRAVENOUS | Status: DC | PRN
  Administered 2018-05-13: 20 mg via INTRAVENOUS
  Administered 2018-05-13: 10 mg via INTRAVENOUS
  Administered 2018-05-13: 20 mg via INTRAVENOUS
  Administered 2018-05-13: 50 mg via INTRAVENOUS
  Administered 2018-05-13 (×2): 20 mg via INTRAVENOUS

## 2018-05-13 MED ORDER — ENOXAPARIN SODIUM 40 MG/0.4ML ~~LOC~~ SOLN
40.0000 mg | SUBCUTANEOUS | Status: DC
  Administered 2018-05-14: 40 mg via SUBCUTANEOUS
  Filled 2018-05-13: qty 0.4

## 2018-05-13 MED ORDER — ONDANSETRON 4 MG PO TBDP: 4.0000 mg | ORAL_TABLET | Freq: Four times a day (QID) | ORAL | Status: DC | PRN

## 2018-05-13 MED ORDER — SUGAMMADEX SODIUM 200 MG/2ML IV SOLN
INTRAVENOUS | Status: DC | PRN
  Administered 2018-05-13: 200 mg via INTRAVENOUS

## 2018-05-13 MED ORDER — DEXAMETHASONE SODIUM PHOSPHATE 10 MG/ML IJ SOLN
INTRAMUSCULAR | Status: AC
  Filled 2018-05-13: qty 1

## 2018-05-13 MED ORDER — PROCHLORPERAZINE EDISYLATE 10 MG/2ML IJ SOLN: 5.0000 mg | Freq: Four times a day (QID) | INTRAMUSCULAR | Status: DC | PRN

## 2018-05-13 MED ORDER — FENTANYL CITRATE (PF) 250 MCG/5ML IJ SOLN
INTRAMUSCULAR | Status: DC | PRN
  Administered 2018-05-13 (×2): 50 ug via INTRAVENOUS
  Administered 2018-05-13: 100 ug via INTRAVENOUS
  Administered 2018-05-13: 50 ug via INTRAVENOUS

## 2018-05-13 MED ORDER — BUPIVACAINE LIPOSOME 1.3 % IJ SUSP
20.0000 mL | Freq: Once | INTRAMUSCULAR | Status: AC
  Administered 2018-05-13: 20 mL
  Filled 2018-05-13: qty 20

## 2018-05-13 MED ORDER — GABAPENTIN 300 MG PO CAPS
300.0000 mg | ORAL_CAPSULE | Freq: Two times a day (BID) | ORAL | Status: DC
  Administered 2018-05-13 – 2018-05-14 (×2): 300 mg via ORAL
  Filled 2018-05-13 (×2): qty 1

## 2018-05-13 MED ORDER — MIDAZOLAM HCL 5 MG/5ML IJ SOLN
INTRAMUSCULAR | Status: DC | PRN
  Administered 2018-05-13: 2 mg via INTRAVENOUS

## 2018-05-13 MED ORDER — ALUM & MAG HYDROXIDE-SIMETH 200-200-20 MG/5ML PO SUSP
30.0000 mL | Freq: Four times a day (QID) | ORAL | Status: DC | PRN
  Administered 2018-05-13: 30 mL via ORAL
  Filled 2018-05-13: qty 30

## 2018-05-13 MED ORDER — DIPHENHYDRAMINE HCL 50 MG/ML IJ SOLN: 12.5000 mg | Freq: Four times a day (QID) | INTRAMUSCULAR | Status: DC | PRN

## 2018-05-13 MED ORDER — METOCLOPRAMIDE HCL 10 MG PO TABS: 10.0000 mg | ORAL_TABLET | Freq: Four times a day (QID) | ORAL | Status: DC | PRN

## 2018-05-13 MED ORDER — OXYCODONE HCL 5 MG/5ML PO SOLN
5.0000 mg | Freq: Once | ORAL | Status: DC | PRN
  Filled 2018-05-13: qty 5

## 2018-05-13 MED ORDER — BUPIVACAINE-EPINEPHRINE 0.5% -1:200000 IJ SOLN
INTRAMUSCULAR | Status: DC | PRN
  Administered 2018-05-13: 30 mL

## 2018-05-13 MED ORDER — LIDOCAINE 2% (20 MG/ML) 5 ML SYRINGE
INTRAMUSCULAR | Status: AC
  Filled 2018-05-13: qty 5

## 2018-05-13 MED ORDER — LIDOCAINE HCL 2 % IJ SOLN
INTRAMUSCULAR | Status: AC
  Filled 2018-05-13: qty 20

## 2018-05-13 MED ORDER — SODIUM CHLORIDE 0.9 % IV SOLN
INTRAVENOUS | Status: DC
  Administered 2018-05-13: 1000 mL via INTRAVENOUS

## 2018-05-13 MED ORDER — METHOCARBAMOL 500 MG PO TABS
750.0000 mg | ORAL_TABLET | Freq: Four times a day (QID) | ORAL | Status: DC | PRN
Start: 2018-05-13 — End: 2018-05-14

## 2018-05-13 MED ORDER — HYDRALAZINE HCL 20 MG/ML IJ SOLN: 5.0000 mg | INTRAMUSCULAR | Status: DC | PRN

## 2018-05-13 MED ORDER — SODIUM CHLORIDE 0.9% FLUSH
3.0000 mL | Freq: Two times a day (BID) | INTRAVENOUS | Status: DC
  Administered 2018-05-13 – 2018-05-14 (×2): 3 mL via INTRAVENOUS

## 2018-05-13 MED ORDER — SODIUM CHLORIDE 0.9% FLUSH: 3.0000 mL | INTRAVENOUS | Status: DC | PRN

## 2018-05-13 MED ORDER — ONDANSETRON HCL 4 MG PO TABS: 4.0000 mg | ORAL_TABLET | Freq: Three times a day (TID) | ORAL | 10 refills | Status: DC | PRN

## 2018-05-13 MED ORDER — SIMETHICONE 80 MG PO CHEW: 40.0000 mg | CHEWABLE_TABLET | Freq: Four times a day (QID) | ORAL | Status: DC | PRN

## 2018-05-13 MED ORDER — LIDOCAINE 2% (20 MG/ML) 5 ML SYRINGE
INTRAMUSCULAR | Status: DC | PRN
  Administered 2018-05-13: 1.5 mg/kg/h via INTRAVENOUS

## 2018-05-13 MED ORDER — BUPIVACAINE-EPINEPHRINE (PF) 0.5% -1:200000 IJ SOLN
INTRAMUSCULAR | Status: AC
  Filled 2018-05-13: qty 30

## 2018-05-13 MED ORDER — MENTHOL 3 MG MT LOZG: 1.0000 | LOZENGE | OROMUCOSAL | Status: DC | PRN

## 2018-05-13 MED ORDER — HYDROMORPHONE HCL 1 MG/ML IJ SOLN: 0.5000 mg | INTRAMUSCULAR | Status: DC | PRN

## 2018-05-13 MED ORDER — HYDROCORTISONE 2.5 % RE CREA: 1.0000 "application " | TOPICAL_CREAM | Freq: Four times a day (QID) | RECTAL | Status: DC | PRN

## 2018-05-13 MED ORDER — SODIUM CHLORIDE 0.9 % IV SOLN
2.0000 g | Freq: Once | INTRAVENOUS | Status: AC
  Administered 2018-05-13: 2 g via INTRAVENOUS

## 2018-05-13 MED ORDER — CEFTRIAXONE SODIUM 2 G IJ SOLR
INTRAMUSCULAR | Status: AC
  Filled 2018-05-13: qty 20

## 2018-05-13 MED ORDER — GABAPENTIN 300 MG PO CAPS
300.0000 mg | ORAL_CAPSULE | ORAL | Status: AC
  Administered 2018-05-13: 300 mg via ORAL
  Filled 2018-05-13: qty 1

## 2018-05-13 MED ORDER — ROCURONIUM BROMIDE 10 MG/ML (PF) SYRINGE
PREFILLED_SYRINGE | INTRAVENOUS | Status: AC
  Filled 2018-05-13: qty 5

## 2018-05-13 MED ORDER — ACETAMINOPHEN 500 MG PO TABS
1000.0000 mg | ORAL_TABLET | Freq: Three times a day (TID) | ORAL | Status: DC
  Administered 2018-05-13 – 2018-05-14 (×2): 1000 mg via ORAL
  Filled 2018-05-13 (×2): qty 2

## 2018-05-13 MED ORDER — ONDANSETRON HCL 4 MG/2ML IJ SOLN: 4.0000 mg | Freq: Four times a day (QID) | INTRAMUSCULAR | Status: DC | PRN

## 2018-05-13 MED ORDER — PROPOFOL 10 MG/ML IV BOLUS
INTRAVENOUS | Status: AC
  Filled 2018-05-13: qty 20

## 2018-05-13 MED ORDER — LACTATED RINGERS IV SOLN
INTRAVENOUS | Status: DC
  Administered 2018-05-13 (×2): via INTRAVENOUS

## 2018-05-13 MED ORDER — ENALAPRILAT 1.25 MG/ML IV SOLN
0.6250 mg | Freq: Four times a day (QID) | INTRAVENOUS | Status: DC | PRN
  Filled 2018-05-13: qty 1

## 2018-05-13 MED ORDER — PROMETHAZINE HCL 25 MG/ML IJ SOLN: 6.2500 mg | INTRAMUSCULAR | Status: DC | PRN

## 2018-05-13 MED ORDER — PROPOFOL 10 MG/ML IV BOLUS
INTRAVENOUS | Status: DC | PRN
  Administered 2018-05-13: 120 mg via INTRAVENOUS

## 2018-05-13 MED ORDER — OXYCODONE HCL 5 MG PO TABS
5.0000 mg | ORAL_TABLET | ORAL | Status: DC | PRN
  Administered 2018-05-13: 10 mg via ORAL
  Filled 2018-05-13: qty 2

## 2018-05-13 MED ORDER — POLYETHYLENE GLYCOL 3350 17 G PO PACK: 17.0000 g | PACK | Freq: Every day | ORAL | Status: DC | PRN

## 2018-05-13 MED ORDER — HYDROCORTISONE 1 % EX CREA: 1.0000 "application " | TOPICAL_CREAM | Freq: Three times a day (TID) | CUTANEOUS | Status: DC | PRN

## 2018-05-13 MED ORDER — DEXAMETHASONE SODIUM PHOSPHATE 10 MG/ML IJ SOLN
INTRAMUSCULAR | Status: DC | PRN
  Administered 2018-05-13: 8 mg via INTRAVENOUS

## 2018-05-13 MED ORDER — GUAIFENESIN-DM 100-10 MG/5ML PO SYRP: 10.0000 mL | ORAL_SOLUTION | ORAL | Status: DC | PRN

## 2018-05-13 MED ORDER — LACTATED RINGERS IV SOLN: 1000.0000 mL | Freq: Three times a day (TID) | INTRAVENOUS | Status: DC | PRN

## 2018-05-13 MED ORDER — HYDROMORPHONE HCL 1 MG/ML IJ SOLN
0.2500 mg | INTRAMUSCULAR | Status: DC | PRN
  Administered 2018-05-13 (×2): 0.5 mg via INTRAVENOUS

## 2018-05-13 SURGICAL SUPPLY — 66 items
APPLIER CLIP 5 13 M/L LIGAMAX5 (MISCELLANEOUS)
APPLIER CLIP ROT 10 11.4 M/L (STAPLE)
APR CLP MED LRG 11.4X10 (STAPLE)
APR CLP MED LRG 5 ANG JAW (MISCELLANEOUS)
BAG SPEC RTRVL 10 TROC 200 (ENDOMECHANICALS) ×1
BLADE SURG SZ11 CARB STEEL (BLADE) ×2 IMPLANT
CHLORAPREP W/TINT 26ML (MISCELLANEOUS) ×2 IMPLANT
CLIP APPLIE 5 13 M/L LIGAMAX5 (MISCELLANEOUS) IMPLANT
CLIP APPLIE ROT 10 11.4 M/L (STAPLE) IMPLANT
COVER SURGICAL LIGHT HANDLE (MISCELLANEOUS) ×2 IMPLANT
COVER TIP SHEARS 8 DVNC (MISCELLANEOUS) ×1 IMPLANT
COVER TIP SHEARS 8MM DA VINCI (MISCELLANEOUS) ×1
DECANTER SPIKE VIAL GLASS SM (MISCELLANEOUS) ×2 IMPLANT
DRAIN CHANNEL 19F RND (DRAIN) ×1 IMPLANT
DRAIN PENROSE 18X1/2 LTX STRL (DRAIN) IMPLANT
DRAPE ARM DVNC X/XI (DISPOSABLE) ×4 IMPLANT
DRAPE COLUMN DVNC XI (DISPOSABLE) ×1 IMPLANT
DRAPE DA VINCI XI ARM (DISPOSABLE) ×4
DRAPE DA VINCI XI COLUMN (DISPOSABLE) ×1
DRAPE WARM FLUID 44X44 (DRAPE) ×2 IMPLANT
DRSG TEGADERM 2-3/8X2-3/4 SM (GAUZE/BANDAGES/DRESSINGS) ×5 IMPLANT
ELECT REM PT RETURN 15FT ADLT (MISCELLANEOUS) ×2 IMPLANT
ENDOLOOP SUT PDS II  0 18 (SUTURE)
ENDOLOOP SUT PDS II 0 18 (SUTURE) IMPLANT
EVACUATOR SILICONE 100CC (DRAIN) ×1 IMPLANT
FELT TEFLON 4 X1 (Mesh General) ×1 IMPLANT
GAUZE SPONGE 2X2 8PLY STRL LF (GAUZE/BANDAGES/DRESSINGS) ×1 IMPLANT
GLOVE ECLIPSE 8.0 STRL XLNG CF (GLOVE) ×4 IMPLANT
GLOVE INDICATOR 8.0 STRL GRN (GLOVE) ×4 IMPLANT
GOWN STRL REUS W/TWL XL LVL3 (GOWN DISPOSABLE) ×8 IMPLANT
IRRIG SUCT STRYKERFLOW 2 WTIP (MISCELLANEOUS) ×2
IRRIGATION SUCT STRKRFLW 2 WTP (MISCELLANEOUS) ×1 IMPLANT
KIT BASIN OR (CUSTOM PROCEDURE TRAY) ×2 IMPLANT
MESH PHASIX RESORB RECT 10X15 (Mesh General) ×1 IMPLANT
NDL INSUFFLATION 14GA 120MM (NEEDLE) ×1 IMPLANT
NEEDLE HYPO 22GX1.5 SAFETY (NEEDLE) ×2 IMPLANT
NEEDLE INSUFFLATION 14GA 120MM (NEEDLE) ×2 IMPLANT
PACK CARDIOVASCULAR III (CUSTOM PROCEDURE TRAY) ×2 IMPLANT
PAD POSITIONING PINK XL (MISCELLANEOUS) ×1 IMPLANT
POUCH RETRIEVAL ECOSAC 10 (ENDOMECHANICALS) IMPLANT
POUCH RETRIEVAL ECOSAC 10MM (ENDOMECHANICALS) ×1
SCISSORS LAP 5X45 EPIX DISP (ENDOMECHANICALS) ×2 IMPLANT
SEAL CANN UNIV 5-8 DVNC XI (MISCELLANEOUS) ×4 IMPLANT
SEAL XI 5MM-8MM UNIVERSAL (MISCELLANEOUS) ×4
SEALER VESSEL DA VINCI XI (MISCELLANEOUS) ×1
SEALER VESSEL EXT DVNC XI (MISCELLANEOUS) ×1 IMPLANT
SOLUTION ELECTROLUBE (MISCELLANEOUS) ×2 IMPLANT
SPONGE GAUZE 2X2 STER 10/PKG (GAUZE/BANDAGES/DRESSINGS) ×1
SPONGE LAP 18X18 RF (DISPOSABLE) ×2 IMPLANT
SUT ETHIBOND 0 36 GRN (SUTURE) ×6 IMPLANT
SUT ETHIBOND NAB CT1 #1 30IN (SUTURE) ×7 IMPLANT
SUT MNCRL AB 4-0 PS2 18 (SUTURE) ×3 IMPLANT
SUT PROLENE 2 0 SH DA (SUTURE) ×1 IMPLANT
SUT V-LOC BARB 180 2/0GR6 GS22 (SUTURE) ×4
SUTURE V-LC BRB 180 2/0GR6GS22 (SUTURE) IMPLANT
SYR 10ML LL (SYRINGE) ×2 IMPLANT
SYR 20CC LL (SYRINGE) ×2 IMPLANT
TIP INNERVISION DETACH 40FR (MISCELLANEOUS) IMPLANT
TIP INNERVISION DETACH 50FR (MISCELLANEOUS) IMPLANT
TIP INNERVISION DETACH 56FR (MISCELLANEOUS) IMPLANT
TIPS INNERVISION DETACH 40FR (MISCELLANEOUS)
TOWEL OR 17X26 10 PK STRL BLUE (TOWEL DISPOSABLE) ×2 IMPLANT
TOWEL OR NON WOVEN STRL DISP B (DISPOSABLE) ×2 IMPLANT
TRAY FOLEY MTR SLVR 16FR STAT (SET/KITS/TRAYS/PACK) IMPLANT
TROCAR ADV FIXATION 5X100MM (TROCAR) ×2 IMPLANT
TUBING INSUFFLATION 10FT LAP (TUBING) ×2 IMPLANT

## 2018-05-13 NOTE — Op Note (Signed)
05/13/2018  6:38 PM  PATIENT:  Ronald Mccormick  72 y.o. male  Patient Care Team: Christain Sacramento, MD as PCP - General (Family Medicine) Michael Boston, MD as Consulting Physician (General Surgery) Armbruster, Carlota Raspberry, MD as Consulting Physician (Gastroenterology)  PRE-OPERATIVE DIAGNOSIS:  SYMPTOMATIC BILIARY COLIC, HIATAL HERNIA  POST-OPERATIVE DIAGNOSIS:  HIATAL HERNIA WITH REFLUX, CHRONIC CHLOECYSTITIS  PROCEDURE:   1. ROBOTIC reduction of paraesophageal hiatal hernia 2. Type II mediastinal dissection. 3. Primary repair of hiatal hernia over pledgets.  4. Anterior & posterior gastropexy. 5. Toupet(posterior 010 degree)  fundoplication x4 cm  6. Mesh reinforcement with absorbable mesh 7. ROBOTIC cholecystectomy  SURGEON:  [name], MD  ASSIST:   Leighton Ruff, MD, FACS. Neko Futhey, PA-S, [employer]  ANESTHESIA:   local and general  EBL:  Total I/O In: 1200 [I.V.:1200] Out: 104 [Blood:50]  Delay start of Pharmacological VTE agent (>24hrs) due to surgical blood loss or risk of bleeding:  no  ANESTHESIA: 1. General anesthesia. 2. Local anesthetic in a field block around all port sites.  SPECIMEN:  Mediastinal hernia sac (not sent).  DRAINS:  A 19-French Blake drain goes from the right upper quadrant along the lesser curvature of the stomach into the mediastinum.  COUNTS:  YES  PLAN OF CARE: Admit to inpatient   PATIENT DISPOSITION:  PACU - hemodynamically stable.  INDICATION:   Patient with symptomatic paraesophageal hiatal hernia.  The patient has had extensive work-up & we feel the patient will benefit from repair.  Also biliary colic with gallstones:  The anatomy & physiology of the foregut and anti-reflux mechanism was discussed.  The pathophysiology of hiatal herniation and GERD was discussed.  Natural history risks without surgery was discussed.   The patient's symptoms are not adequately controlled by medicines and other non-operative  treatments.  I feel the risks of no intervention will lead to serious problems that outweigh the operative risks; therefore, I recommended surgery to reduce the hiatal hernia out of the chest and fundoplication to rebuild the anti-reflux valve and control reflux better.  Need for a thorough workup to rule out the differential diagnosis and plan treatment was explained.  I explained laparoscopic techniques with possible need for an open approach.The anatomy & physiology of hepatobiliary & pancreatic function was discussed.  The pathophysiology of gallbladder dysfunction was discussed.  Natural history risks without surgery was discussed.   I feel the risks of no intervention will lead to serious problems that outweigh the operative risks; therefore, I recommended cholecystectomy to remove the pathology.  I explained laparoscopic techniques with possible need for an open approach.  Probable cholangiogram to evaluate the bilary tract was explained as well.    Risks such as bleeding, infection, abscess, leak, need for further treatment, heart attack, death, and other risks were discussed.   I noted a good likelihood this will help address the problem.  Goals of post-operative recovery were discussed as well.  Possibility that this will not correct all symptoms was explained.  Post-operative dysphagia, need for short-term liquid & pureed diet, inability to vomit, possibility of reherniation, possible need for medicines to help control symptoms in addition to surgery were discussed.  We will work to minimize complications.   Educational handouts further explaining the pathology, treatment options, and dysphagia diet was given as well.  Questions were answered.  The patient expresses understanding & wishes to proceed with surgery.  Risks such as bleeding, infection, abscess, leak, injury to other organs, need  for repair of tissues / organs, need for further treatment, stroke, heart attack, death, and other risks were  discussed.  I noted a good likelihood this will help address the problem.  Possibility that this will not correct all abdominal symptoms was explained.  Goals of post-operative recovery were discussed as well.  We will work to minimize complications.  An educational handout further explaining the pathology and treatment options was given as well.  Questions were answered.  The patient expresses understanding & wishes to proceed with surgery.   OR FINDINGS:   Moderate-sized paraesophageal hiatal hernia with 25% of the stomach in the mediastinum.  There was a 7 x 6 cm hiatal defect. It is a primary repair over pledgets. Mesh reinforcement was used with 15x10cm Phasix Mesh (a knitted monofilament mesh scaffold using Poly-4-hydroxybutyrate (P4HB), a biologically derived, fully resorbable material)  The patient has a 4 cm Toupet fundoplication (posterior 073 degree wrap).  The patient has had anterior and posterior gastropexy.  Gallbladder wall thickening and pale changes consistent with chronic cholecystitis.  DESCRIPTION:   Informed consent was confirmed.  The patient received IV antibiotics prior to incision.  The underwent general anesthesia without difficulty.  A Foley catheter sterilely placed.  The patient was positioned in split leg with arms tucked. The abdomen was prepped and draped in the sterile fashion.  Surgical time-out confirmed our plan.  I placed a 5 mm port in the left subcostal region using Varess entry technique with the patient in steep reverse Trendelenburg and left side up.  Entry was clean.  We induced carbon dioxide insufflation.  Camera inspection revealed no injury.  Under direct visualization, I placed extra ports.  I also placed a 5 mm port in the left subxiphoid region under direct visualization.  I removed that and placed an Omega-shaped rigid Nathanson liver retractor to lift the left lateral sector of the liver anteriorly to expose the esophageal hiatus.  This was  secured to the bed using the iron man system.  The Xi robot was carefully docked and instruments placed and advanced under direct visualization.  We focused on dissection.  We grasped the anterior mediastinal sac at the apex of the crus.  I scored through that and got into the anterior mediastinum.  I was able to free the mediastinal sac from its attachments to the pericardium and bilateral pleura using primarily focused gentle blunt dissection as well as focused vessel sealer dissection.  I transected phrenoesophageal attachments to the inner right crus, preserving a two centimeter cuff of mediastinal sac until I found the base of the crura.  I then came around anteriorly on the left side and freed up the phrenoesophageal attachments of the mediastinal sac on the medial part of the left crus on the superior half.  I did careful mediastinal dissection to free the mediastinal sac.  With that, we could relieve the suction cup affect of the hernia sac and help reduce the stomach back down into the abdomen, flipped back approriately.    We ligated the short gastrics along the lesser curvature of the stomach about a third the way down and then came up proximally over the fundus.  We released the attachments of the stomach to the retroperitoneum until we were able to connect with the prior dissection on the left crus.  We completed the release of phrenoesophageal attachments to the medial part of the left crus down to its base.  With this, we had circumferential mobilization.  He did have  a lot of fatty tissues up in the region.  We placed the stomach and esophagus on axial tension.  I then did a Type II mediastinal dissection where I freed the esophagus from its attachments to the aorta, spine, pleura, and pericardium using primarily gentle blunt as well as focused ultrasonic dissection.  We saw the anterior & posterior vagus nerves intact.  We preserved it at all times.  I procedded to dissect about 15 cm  proximally into the mediastinum.  With that I could straighten out the esophagus and get 4 cm of intra-abdominal length of the esophagus at a best estimation.  I freed the anterior mediastinal sac off the esophagus & stomach.  Ended up having to skeletonize a rather fatty hernia sac and transected off.  Wwe saw the anterior vagus nerve and freed the sac off of the vagus.  I dissected out & removed the fatty  epiphrenic pads at the esophagogastric junction.  Took some time to help free of the numerous fat pad and fatty tissues.  With that, I could better define the esophagogastric junction.  I confirmed the the patient had 4 cm of intra-abdominal esophageal length off tension.  I made an effort to try and help mobilize to expose the left posterior small diaphragmatic [place] hernia seen on CT scan.  However could not see it intraperitoneally following the diaphragm medial to the spleen and posterior to the crura.  There are some subtle fatty bulging of the retroperitoneum.  It was clear that is very posterior small medial perispinal diaphoretic defect seen on CT scan as involving the retroperitoneum and not actually directly through the intraperitoneal diaphragm.  I decided to hold off on any more aggressive dissection since the intraperitoneal diaphragm was otherwise healthy and current both side were healthy and intact and did not require any more aggressive repair.  I brought the fundus of the stomach posterior to the esophagus over to the right side.  The wrap was mobile with the classic shoe shine maneuver.  Wrap became together gently.  We reflected the stomach left laterally and closed the esophageal hiatus using 0 Ethibond stitch using horizontal mattress stitches with pledgets on both sides.  I did that x3 stitches.  The crura had good substance and they came together well without any tension.  Because of the larger defect this overweight male, I reinforced the repair using a 5 x 10 cm biologic  Phasix mesh.  I cut out a 2x4 cm part of mesh in the middle third of the mesh such that the mesh had a broad U shape transversely, one tail 5 cm wide & the other 8cm.  We brought the mesh in and laid it over the crural repair, tails anterior over the crura.  I tucked the broader "U" tail of the mesh between the left diaphragm and the spleen, the narrower "U" tail over the right crus.  I then secured the posterior & anterior corners of the narrower"U" tail with 0 Ethibond upper interrupted suture to the right crus.  I secured to the left lateral and left superior sides of the broader "U" tail to the left diaphragm band with 2-0 V-lock running suture.   I brought the fundus of the stomach behind the esophagus and cardia to set up a fundoplication wrap.  I did a posterior gastropexy by taking of #1 Ethibond stitches to the posterior part of the right side of the wrap and thru the Phasix mesh and crural closure.  I placed  a similar stitch on the left anterior side as well.  That way the stomach covered the mesh and protected it from any esophageal exposure.  With the anterior and posterior gastropexy's, stomach laid well for a fundoplication wrap.  Because of evidence of significant esophageal dysmotility and preoperative manometry, I decided to do a partial posterior Toupet fundoplication. I did a classic 4cm fundoplication on the true esophagus above the cardia using Ethibond stitch in the left side of the wrap, then anteriolateral esophagus and tied that down.  They state similar stitch on the right side of the wrap.  Did 3 stitches.  I measured it and it was 4 cm in length.  The wrap was soft and floppy.  Hiatal hernia closure was snug but not super tight.    Treated with cholecystectomy.  Redirected instruments the right upper quadrant.  Grasped the fundus of the gallbladder and elevated cephalad.  Using vessel sealer to mobilize the gallbladder off its attachments on the liver bed especially right posterior  laterally.  Came around the infundibulum anteriorly.  Was able to skeletonize and isolate the anterior and posterior cystic artery branches after getting a good 100 360 critical view.  Transected those.  Freed the proximal two thirds of the gallbladder off the liver bed to get a good critical view such that the only thing remaining was the cystic duct going from the infundibulum down to the porta hepatis.  Placed white plastic hemologic clips around the cystic duct, 2 on the porta side, one on the infundibular side.  Transected in between.  Freed the gallbladder from its remaining attachments on the liver bed.  Controlled hemostasis on the liver bed with touch point hook cautery.  We did copious irrigation.  Inspected the liver bed as well as the fundoplication and hiatal hernia repair region.  Hemostasis was good.    We undocked the robot.  Placed the gallbladder as well as a transected hernia sac and fat pad particles in and Eco-sac and removed it out the right upper quadrant port site.  I did have to dilated up to get sac with the gallbladder fat pads out.  I placed the drain through that and directed it over the fundoplication and apex of the curette into the mediastinum.  We removed the [facility] liver retractor under direct visualization.  Again inspection and hemostasis was good.  I evacuated carbon dioxide and removed the ports.  The right upper quadrant port site where the drain came through was tightened back down with a 0 Vicryl suture at the fascia.  To home Prolene suture around the drain.  The other port sites were closed with Monocryl and sterile dressings applied.  The patient is being extubated and brought back to the recovery room.  I discussed postop care in detail with the patient and family in in the office.  Discussed again with the patient in the holding area. I discussed operative findings, updated the patient's status, discussed probable steps to recovery, and gave postoperative  recommendations to the patient's family.  Recommendations were made.  Questions were answered.  They expressed understanding & appreciation.  [name], M.D., F.A.C.S. Gastrointestinal and Minimally Invasive Surgery [facility] [address], [place],  [id number] [phone] Main / Paging

## 2018-05-13 NOTE — Anesthesia Postprocedure Evaluation (Signed)
Anesthesia Post Note  Patient: Ronald Mccormick  Procedure(s) Performed: XI ROBOTIC HIATAL HERNIA REPAIR,TOUPET FUNDOPLICATION (N/A Abdomen) ROBOTIC ASSISTED LAPAROSCOPIC CHOLECYSTECTOMY (N/A Abdomen)     Patient location during evaluation: PACU Anesthesia Type: General Level of consciousness: sedated and patient cooperative Pain management: pain level controlled Vital Signs Assessment: post-procedure vital signs reviewed and stable Respiratory status: spontaneous breathing Cardiovascular status: stable Anesthetic complications: no    Last Vitals:  Vitals:   05/13/18 1915 05/13/18 1930  BP: (!) 169/103 (!) 165/104  Pulse: 81 79  Resp: 15 17  Temp:  36.5 C  SpO2: 100% 97%    Last Pain:  Vitals:   05/13/18 1930  TempSrc:   PainSc: [place]

## 2018-05-13 NOTE — H&P (Signed)
Ronald Mccormick DOB: 1946-03-30 Married / Language: [name] / Race: White Male  ` ` Patient sent for surgical consultation at the request of Dr. Havery Moros  Chief Complaint: Abdominal pain and gallstones.  Hiatal hernia  The patient is a pleasant male that had episode of upper abdominal pain that scared him. [facility]. Negative cardiopulmonary workup. CT of chest abdomen revealed gallstone. Recommendation follow up with gastroenterology. He does have a history of heartburn and reflux fall by Dr. Havery Moros. Usually controlled with to excellent. He noted that he had pain that woke him up. He had eaten some spaghetti. It was a sharp upper abdominal pain. Radiates to the right side in his back. He felt very nauseated. He tried to vomit. The pain was extremely intense and the worst ever. He called EMS. Received some morphine area after couple shots the pain became better controlled. He does have a history of similar attack in 2015. Was not as intense. He cannot recall what he ate.   He does note he has some heartburn and reflux. Dexilant usually controls it. This was not like that. Denies any dysphagia to solids or liquids. Usually moves his bowels about once or twice a day. However since this attack she's gone about 3 or 4 times a day. Can walk couple miles without difficulty. He does not smoke. He can get a little bit floor with eating but no severe burping or constipation. No history of gastroparesis or diabetes. He is not on any blood thinners. Not any chronic pain medication. History of polyps in the past. He's had a few colonoscopies. Colon cancer gauge 43. Also esophageal cancer. Was a heavy smoker until his myocardial infarction and 3. Patient notes that a lot of his family members have had gallstones. Patient himself has had no prior surgery.  No personal nor family history of inflammatory bowel disease, irritable bowel syndrome, allergy such  as Celiac Sprue, dietary/dairy problems, colitis, ulcers nor gastritis. No recent sick contacts/gastroenteritis. No travel outside the country. No changes in diet. No dysphagia to solids or liquids. No significant heartburn or reflux. No hematochezia, hematemesis, coffee ground emesis. No evidence of prior gastric/peptic ulceration.  Patient underwent endoscopy and found to have moderate sized hiatal hernia with gastric polyps.  Patient agitated from the medication since having some heartburn reflux issues.  Manometry done.  Some esophageal dysmotility.  He is interested in proceeding with fundoplication hiatal hernia repair at the same time of cholecystectomy.   (Review of systems as stated in this history (HPI) or in the review of systems. Otherwise all other 12 point ROS are negative) ` ` `   Past Surgical History Ronald Mccormick; 03/30/2018 9:12 AM) Colon Polyp Removal - Open Tonsillectomy  Diagnostic Studies History Ronald Mccormick; 03/30/2018 9:12 AM) Colonoscopy 1-5 years ago  Allergies Ronald Mccormick; 03/30/2018 9:07 AM) No Known Drug Allergies [03/30/2018]: Allergies Reconciled  Medication History Ronald Mccormick; 03/30/2018 9:08 AM) Dexilant (60MG  Capsule DR, 1 Oral daily) Active. Medications Reconciled  Social History Ronald Mccormick; 03/30/2018 9:12 AM) Alcohol use Occasional alcohol use. Caffeine use Coffee, Tea. No drug use Tobacco use Former smoker.  Family History Ronald Mccormick; 03/30/2018 9:12 AM) Alcohol Abuse Father, Mother. Colon Cancer Father. Colon Polyps Father. Heart Disease Father, Mother. Heart disease in male family member before age 27 Hypertension Mother.  Other Problems Ronald Mccormick; 03/30/2018 9:12 AM) Back Pain Cholelithiasis Gastroesophageal Reflux Disease     Review of Systems Ronald Mccormick; 03/30/2018 9:13 AM) General Present- Fatigue. Not Present-  Appetite Loss, Chills, Fever, Night Sweats, Weight Gain and  Weight Loss. Skin Not Present- Change in Wart/Mole, Dryness, Hives, Jaundice, New Lesions, Non-Healing Wounds, Rash and Ulcer. HEENT Present- Hearing Loss and Ringing in the Ears. Not Present- Earache, Hoarseness, Nose Bleed, Oral Ulcers, Seasonal Allergies, Sinus Pain, Sore Throat, Visual Disturbances, Wears glasses/contact lenses and Yellow Eyes. Respiratory Not Present- Bloody sputum, Chronic Cough, Difficulty Breathing, Snoring and Wheezing. Breast Not Present- Breast Mass, Breast Pain, Nipple Discharge and Skin Changes. Cardiovascular Not Present- Chest Pain, Difficulty Breathing Lying Down, Leg Cramps, Palpitations, Rapid Heart Rate, Shortness of Breath and Swelling of Extremities. Gastrointestinal Present- Abdominal Pain and Change in Bowel Habits. Not Present- Bloating, Bloody Stool, Chronic diarrhea, Constipation, Difficulty Swallowing, Excessive gas, Gets full quickly at meals, Hemorrhoids, Indigestion, Nausea, Rectal Pain and Vomiting. Male Genitourinary Not Present- Blood in Urine, Change in Urinary Stream, Frequency, Impotence, Nocturia, Painful Urination, Urgency and Urine Leakage. Musculoskeletal Not Present- Back Pain, Joint Pain, Joint Stiffness, Muscle Pain, Muscle Weakness and Swelling of Extremities. Neurological Not Present- Decreased Memory, Fainting, Headaches, Numbness, Seizures, Tingling, Tremor, Trouble walking and Weakness. Psychiatric Not Present- Anxiety, Bipolar, Change in Sleep Pattern, Depression, Fearful and Frequent crying. Endocrine Not Present- Cold Intolerance, Excessive Hunger, Hair Changes, Heat Intolerance, Hot flashes and New Diabetes. Hematology Not Present- Blood Thinners, Easy Bruising, Excessive bleeding, Gland problems, HIV and Persistent Infections.  Vitals Ronald Mccormick; 03/30/2018 9:09 AM) 03/30/2018 9:08 AM Weight: 178.38 lb Height: 70in Body Surface Area: 1.99 m Body Mass Index: 25.59 kg/m  Temp.: 97.35F(Oral)  Pulse: 86 (Regular)   BP: 134/82 (Sitting, Left Arm, Standard)  BP (!) 149/100   Pulse 73   Temp 97.8 F (36.6 C) (Oral)   Resp 18   Ht 5\' 10"  (1.778 m)   Wt 80.3 kg (177 lb)   SpO2 98%   BMI 25.40 kg/m       Physical Exam [name] MD; 03/30/2018 9:53 AM)  General Mental Status-Alert. General Appearance-Not in acute distress, Not Sickly. Orientation-Oriented X3. Hydration-Well hydrated. Voice-Normal.  Integumentary Global Assessment Upon inspection and palpation of skin surfaces of the - Axillae: non-tender, no inflammation or ulceration, no drainage. and Distribution of scalp and body hair is normal. General Characteristics Temperature - normal warmth is noted.  Head and Neck Head-normocephalic, atraumatic with no lesions or palpable masses. Face Global Assessment - atraumatic, no absence of expression. Neck Global Assessment - no abnormal movements, no bruit auscultated on the right, no bruit auscultated on the left, no decreased range of motion, non-tender. Trachea-midline. Thyroid Gland Characteristics - non-tender.  Eye Eyeball - Left-Extraocular movements intact, No Nystagmus. Eyeball - Right-Extraocular movements intact, No Nystagmus. Cornea - Left-No Hazy. Cornea - Right-No Hazy. Sclera/Conjunctiva - Left-No scleral icterus, No Discharge. Sclera/Conjunctiva - Right-No scleral icterus, No Discharge. Pupil - Left-Direct reaction to light normal. Pupil - Right-Direct reaction to light normal.  ENMT Ears Pinna - Left - no drainage observed, no generalized tenderness observed. Right - no drainage observed, no generalized tenderness observed. Nose and Sinuses External Inspection of the Nose - no destructive lesion observed. Inspection of the nares - Left - quiet respiration. Right - quiet respiration. Mouth and Throat Lips - Upper Lip - no fissures observed, no pallor noted. Lower Lip - no fissures observed, no pallor noted.  Nasopharynx - no discharge present. Oral Cavity/Oropharynx - Tongue - no dryness observed. Oral Mucosa - no cyanosis observed. Hypopharynx - no evidence of airway distress observed.  Chest and Lung Exam Inspection Movements -  Normal and Symmetrical. Accessory muscles - No use of accessory muscles in breathing. Palpation Palpation of the chest reveals - Non-tender. Auscultation Breath sounds - Normal and Clear.  Cardiovascular Auscultation Rhythm - Regular. Murmurs & Other Heart Sounds - Auscultation of the heart reveals - No Murmurs and No Systolic Clicks.  Abdomen Inspection Inspection of the abdomen reveals - No Visible peristalsis and No Abnormal pulsations. Umbilicus - No Bleeding, No Urine drainage. Palpation/Percussion Palpation and Percussion of the abdomen reveal - Soft, Non Tender, No Rebound tenderness, No Rigidity (guarding) and No Cutaneous hyperesthesia. Note: Mild discomfort in right upper quadrant but no classic Murphy sign. Abdomen soft. Nontender. Not distended. Moderate diastases recti but no umbilical or incisional hernias. No guarding.  Male Genitourinary Sexual Maturity Tanner 5 - Adult hair pattern and Adult penile size and shape. Note: Moderate bulging of both direct spaces suspicious for inguinal hernias. However not particularly large and not symptomatic. Otherwise normal external male genitalia. No obvious testicular masses  Peripheral Vascular Upper Extremity Inspection - Left - No Cyanotic nailbeds, Not Ischemic. Right - No Cyanotic nailbeds, Not Ischemic.  Neurologic Neurologic evaluation reveals -normal attention span and ability to concentrate, able to name objects and repeat phrases. Appropriate fund of knowledge , normal sensation and normal coordination. Mental Status Affect - not angry, not paranoid. Cranial Nerves-Normal Bilaterally. Gait-Normal.  Neuropsychiatric Mental status exam performed with findings of-able to  articulate well with normal speech/language, rate, volume and coherence, thought content normal with ability to perform basic computations and apply abstract reasoning and no evidence of hallucinations, delusions, obsessions or homicidal/suicidal ideation.  Musculoskeletal Global Assessment Spine, Ribs and Pelvis - no instability, subluxation or laxity. Right Upper Extremity - no instability, subluxation or laxity.  Lymphatic Head & Neck  General Head & Neck Lymphatics: Bilateral - Description - No Localized lymphadenopathy. Axillary  General Axillary Region: Bilateral - Description - No Localized lymphadenopathy. Femoral & Inguinal  Generalized Femoral & Inguinal Lymphatics: Left - Description - No Localized lymphadenopathy. Right - Description - No Localized lymphadenopathy.    Assessment & Plan [name] MD; 03/30/2018 9:54 AM)  CHRONIC CHOLECYSTITIS WITH CALCULUS (K80.10) Impression: Rather classic story biliary colic with at least 2 episodes. I think he would benefit from cholecystectomy. He definitely wants to avoid another attack and is interested in proceeding.  The anatomy & physiology of hepatobiliary & pancreatic function was discussed. The pathophysiology of gallbladder dysfunction was discussed. Natural history risks without surgery was discussed. I feel the risks of no intervention will lead to serious problems that outweigh the operative risks; therefore, I recommended cholecystectomy to remove the pathology. I explained laparoscopic techniques with possible need for an open approach. Probable cholangiogram to evaluate the bilary tract was explained as well.  Risks such as bleeding, infection, abscess, leak, injury to other organs, need for further treatment, heart attack, death, and other risks were discussed. I noted a good likelihood this will help address the problem. Possibility that this will not correct all abdominal symptoms was explained.  Goals of post-operative recovery were discussed as well. We will work to minimize complications. An educational handout further explaining the pathology and treatment options was given as well. Questions were answered. The patient expresses understanding & wishes to proceed with surgery.    Hiatal hernia.  Patient with some symptoms of heartburn or reflux and some mild dysphasia.  Manometry showing some dysmotility.  I still think he benefit from hernia repair as the patient wishes.  I would do  a partial posterior fundoplication as discussed in prior notes.  He is interested in proceeding.  Given the other nearby hernia, low threshold to do mesh reinforcement with Phasix mesh.  The anatomy & physiology of the foregut and anti-reflux mechanism was discussed.  The pathophysiology of hiatal herniation and GERD was discussed.  Natural history risks without surgery was discussed.   The patient's symptoms are not adequately controlled by medicines and other non-operative treatments.  I feel the risks of no intervention will lead to serious problems that outweigh the operative risks; therefore, I recommended surgery to reduce the hiatal hernia out of the chest and fundoplication to rebuild the anti-reflux valve and control reflux better.  Need for a thorough workup to rule out the differential diagnosis and plan treatment was explained.  I explained minimally invasive techniques with possible need for an open approach.  Risks such as bleeding, infection, abscess, leak,injury to other organs, need for repair of tissues / organs, need for further treatment, stroke, heart attack, death, and other risks were discussed.   I noted a good likelihood this will help address the problem.  Goals of post-operative recovery were discussed as well.  Possibility that this will not correct all symptoms was explained.  Post-operative dysphagia, need for short-term liquid & pureed diet, inability to vomit, possibility of  reherniation, possible need for medicines to help control symptoms in addition to surgery were discussed.  We will work to minimize complications.   Educational handouts further explaining the pathology, treatment options, and dysphagia diet was given as well.  Questions were answered.  The patient expresses understanding & wishes to proceed with surgery.     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 I agree that it is unlikely to be the source of any symptoms. Looks like at this contained some retroperitoneal fat. Not containing bowel or other major tumor.  Since implant repair the hiatal hernia, may be able to help patch and reinforcement repair this region as well with some mesh.     [name], M.D., F.A.C.S. Gastrointestinal and Minimally Invasive Surgery [facility] [address] [place], [place] [address] [phone] Main / Paging

## 2018-05-13 NOTE — Interval H&P Note (Signed)
History and Physical Interval Note:  05/13/2018 2:10 PM  Ronald Mccormick  has presented today for surgery, with the diagnosis of SYMPTOMATIC BILIARY COLIC, HIATAL HERNIA  The various methods of treatment have been discussed with the patient and family. After consideration of risks, benefits and other options for treatment, the patient has consented to  Procedure(s): XI ROBOTIC ASSISTED LAPAROSCOPIC NISSEN FUNDOPLICATION (N/A) ROBOTIC ASSISTED LAPAROSCOPIC CHOLECYSTECTOMY (N/A) as a surgical intervention .  The patient's history has been reviewed, patient examined, no change in status, stable for surgery.  I have reviewed the patient's chart and labs.  Questions were answered to the patient's satisfaction.     [name]

## 2018-05-13 NOTE — Anesthesia Preprocedure Evaluation (Addendum)
Anesthesia Evaluation  Patient identified by MRN, date of birth, ID band Patient awake    Reviewed: Allergy & Precautions, NPO status , Patient's Chart, lab work & pertinent test results  Airway Mallampati: II  TM Distance: >3 FB Neck ROM: Full    Dental  (+) Teeth Intact, Dental Advisory Given   Pulmonary former smoker,    breath sounds clear to auscultation       Cardiovascular  Rhythm:Regular Rate:Normal     Neuro/Psych    GI/Hepatic hiatal hernia, GERD  ,  Endo/Other    Renal/GU      Musculoskeletal   Abdominal   Peds  Hematology   Anesthesia Other Findings   Reproductive/Obstetrics                            Anesthesia Physical Anesthesia Plan  ASA: III  Anesthesia Plan: General   Post-op Pain Management:    Induction: Intravenous  PONV Risk Score and Plan: Ondansetron and Dexamethasone  Airway Management Planned: Oral ETT  Additional Equipment:   Intra-op Plan:   Post-operative Plan: Extubation in OR  Informed Consent: I have reviewed the patients History and Physical, chart, labs and discussed the procedure including the risks, benefits and alternatives for the proposed anesthesia with the patient or authorized representative who has indicated his/her understanding and acceptance.   Dental advisory given  Plan Discussed with: CRNA and Anesthesiologist  Anesthesia Plan Comments:         Anesthesia Quick Evaluation

## 2018-05-13 NOTE — Transfer of Care (Signed)
Immediate Anesthesia Transfer of Care Note  Patient: Ronald Mccormick  Procedure(s) Performed: XI ROBOTIC HIATAL HERNIA REPAIR,TOUPET FUNDOPLICATION (N/A Abdomen) ROBOTIC ASSISTED LAPAROSCOPIC CHOLECYSTECTOMY (N/A Abdomen)  Patient Location: PACU  Anesthesia Type:General  Level of Consciousness: awake, alert  and oriented  Airway & Oxygen Therapy: Patient Spontanous Breathing and Patient connected to face mask oxygen  Post-op Assessment: Report given to RN and Post -op Vital signs reviewed and stable  Post vital signs: Reviewed and stable  Last Vitals:  Vitals Value Taken Time  BP    Temp    Pulse 92 05/13/2018  6:48 PM  Resp 14 05/13/2018  6:48 PM  SpO2 99 % 05/13/2018  6:48 PM  Vitals shown include unvalidated device data.  Last Pain:  Vitals:   05/13/18 1132  TempSrc:   PainSc: 0-No pain         Complications: No apparent anesthesia complications

## 2018-05-13 NOTE — Anesthesia Procedure Notes (Signed)
Procedure Name: Intubation Date/Time: 05/13/2018 2:57 PM Performed by: Talbot Grumbling, CRNA Pre-anesthesia Checklist: Patient identified, Emergency Drugs available and Patient being monitored Patient Re-evaluated:Patient Re-evaluated prior to induction Oxygen Delivery Method: Circle system utilized Preoxygenation: Pre-oxygenation with 100% oxygen Induction Type: IV induction Ventilation: Mask ventilation without difficulty Laryngoscope Size: Mac and 3 Grade View: Grade I Tube type: Oral Tube size: 8.0 mm Number of attempts: 1 Airway Equipment and Method: Stylet Placement Confirmation: ETT inserted through vocal cords under direct vision,  positive ETCO2 and breath sounds checked- equal and bilateral Secured at: 22 cm Tube secured with: Tape Dental Injury: Teeth and Oropharynx as per pre-operative assessment

## 2018-05-13 NOTE — Discharge Instructions (Signed)
EATING AFTER YOUR ESOPHAGEAL SURGERY (Stomach Fundoplication, Hiatal Hernia repair, Achalasia surgery, etc)  ######################################################################  EAT Start with a pureed / full liquid diet (see below) Gradually transition to a high fiber diet with a fiber supplement over the next month after discharge.    WALK Walk an hour a day.  Control your pain to do that.    CONTROL PAIN Control pain so that you can walk, sleep, tolerate sneezing/coughing, go up/down stairs.  HAVE A BOWEL MOVEMENT DAILY Keep your bowels regular to avoid problems.  OK to try a laxative to override constipation.  OK to use an antidairrheal to slow down diarrhea.  Call if not better after 2 tries  CALL IF YOU HAVE PROBLEMS/CONCERNS Call if you are still struggling despite following these instructions. Call if you have concerns not answered by these instructions  ######################################################################   After your esophageal surgery, expect some sticking with swallowing over the next 1-2 months.    If food sticks when you eat, it is called "dysphagia".  This is due to swelling around your esophagus at the wrap & hiatal diaphragm repair.  It will gradually ease off over the next few months.  To help you through this temporary phase, we start you out on a pureed (blenderized) diet.  Your first meal in the hospital was thin liquids.  You should have been given a pureed diet by the time you left the hospital.  We ask patients to stay on a pureed diet for the first 2-3 weeks to avoid anything getting "stuck" near your recent surgery.  Don't be alarmed if your ability to swallow doesn't progress according to this plan.  Everyone is different and some diets can advance more or less quickly.     Some BASIC RULES to follow are:  Maintain an upright position whenever eating or drinking.  Take small bites - just a teaspoon size bite at a time.  Eat slowly.   It may also help to eat only one food at a time.  Consider nibbling through smaller, more frequent meals & avoid the urge to eat BIG meals  Do not push through feelings of fullness, nausea, or bloatedness  Do not mix solid foods and liquids in the same mouthful  Try not to "wash foods down" with large gulps of liquids.  Avoid carbonated (bubbly/fizzy) drinks.    Avoid foods that make you feel gassy or bloated.  Start with bland foods first.  Wait on trying greasy, fried, or spicy meals until you are tolerating more bland solids well.  Understand that it will be hard to burp and belch at first.  This gradually improves with time.  Expect to be more gassy/flatulent/bloated initially.  Walking will help your body manage it better.  Consider using medications for bloating that contain simethicone such as  Maalox or Gas-X   Eat in a relaxed atmosphere & minimize distractions.  Avoid talking while eating.    Do not use straws.  Following each meal, sit in an upright position (90 degree angle) for 60 to 90 minutes.  Going for a short walk can help as well  If food does stick, don't panic.  Try to relax and let the food pass on its own.  Sipping WARM LIQUID such as strong hot black tea can also help slide it down.   Be gradual in changes & use common sense:  -If you easily tolerating a certain "level" of foods, advance to the next level gradually -If you are  having trouble swallowing a particular food, then avoid it.   -If food is sticking when you advance your diet, go back to thinner previous diet (the lower LEVEL) for 1-2 days.  LEVEL 1 = PUREED DIET  Do for the first 2 WEEKS AFTER SURGERY  -Foods in this group are pureed or blenderized to a smooth, mashed potato-like consistency.  -If necessary, the pureed foods can keep their shape with the addition of a thickening agent.   -Meat should be pureed to a smooth, pasty consistency.  Hot broth or gravy may be added to the pureed  meat, approximately 1 oz. of liquid per 3 oz. serving of meat. -CAUTION:  If any foods do not puree into a smooth consistency, swallowing will be more difficult.  (For example, nuts or seeds sometimes do not blend well.)  Hot Foods Cold Foods  Pureed scrambled eggs and cheese Pureed cottage cheese  Baby cereals Thickened juices and nectars  Thinned cooked cereals (no lumps) Thickened milk or eggnog  Pureed [place] toast or pancakes Ensure  Mashed potatoes Ice cream  Pureed parsley, au gratin, scalloped potatoes, candied sweet potatoes Fruit or [place] ice, sherbet  Pureed buttered or alfredo noodles Plain yogurt  Pureed vegetables (no corn or peas) Instant breakfast  Pureed soups and creamed soups Smooth pudding, mousse, custard  Pureed scalloped apples Whipped gelatin  Gravies Sugar, syrup, honey, jelly  Sauces, cheese, tomato, barbecue, white, creamed Cream  Any baby food Creamer  Alcohol in moderation (not beer or champagne) Margarine  Coffee or tea Mayonnaise   Ketchup, mustard   Apple sauce   SAMPLE MENU:  PUREED DIET Breakfast Lunch Dinner   Orange juice, 1/2 cup  Cream of wheat, 1/2 cup  Pineapple juice, 1/2 cup  Pureed [place], barley soup, 3/4 cup  Pureed Hawaiian chicken, 3 oz   Scrambled eggs, mashed or blended with cheese, 1/2 cup  Tea or coffee, 1 cup   Whole milk, 1 cup   Non-dairy creamer, 2 Tbsp.  Mashed potatoes, 1/2 cup  Pureed cooled broccoli, 1/2 cup  Apple sauce, 1/2 cup  Coffee or tea  Mashed potatoes, 1/2 cup  Pureed spinach, 1/2 cup  Frozen yogurt, 1/2 cup  Tea or coffee      LEVEL 2 = SOFT DIET  After your first 2 weeks, you can advance to a soft diet.   Keep on this diet until everything goes down easily.  Hot Foods Cold Foods  White fish Cottage cheese  Stuffed fish Junior baby fruit  Baby food meals Semi thickened juices  Minced soft cooked, scrambled, poached eggs nectars  Souffle & omelets Ripe mashed bananas  Cooked  cereals Canned fruit, pineapple sauce, milk  potatoes Milkshake  Buttered or Alfredo noodles Custard  Cooked cooled vegetable Puddings, including tapioca  Sherbet Yogurt  Vegetable soup or alphabet soup Fruit ice, [place] ice  Gravies Whipped gelatin  Sugar, syrup, honey, jelly Junior baby desserts  Sauces:  Cheese, creamed, barbecue, tomato, white Cream  Coffee or tea Margarine   SAMPLE MENU:  LEVEL 2 Breakfast Lunch Dinner   Orange juice, 1/2 cup  Oatmeal, 1/2 cup  Scrambled eggs with cheese, 1/2 cup  Decaffeinated tea, 1 cup  Whole milk, 1 cup  Non-dairy creamer, 2 Tbsp  Pineapple juice, 1/2 cup  Minced beef, 3 oz  Gravy, 2 Tbsp  Mashed potatoes, 1/2 cup  Minced fresh broccoli, 1/2 cup  Applesauce, 1/2 cup  Coffee, 1 cup  [place], barley soup, 3/4 cup  Minced Hawaiian chicken, 3 oz  Mashed potatoes, 1/2 cup  Cooked spinach, 1/2 cup  Frozen yogurt, 1/2 cup  Non-dairy creamer, 2 Tbsp      LEVEL 3 = CHOPPED DIET  -After all the foods in level 2 (soft diet) are passing through well you should advance up to more chopped foods.  -It is still important to cut these foods into small pieces and eat slowly.  Hot Foods Cold Foods  Poultry Cottage cheese  Chopped Swedish meatballs Yogurt  Meat salads (ground or flaked meat) Milk  Flaked fish (tuna) Milkshakes  Poached or scrambled eggs Soft, cold, dry cereal  Souffles and omelets Fruit juices or nectars  Cooked cereals Chopped canned fruit  Chopped [place] toast or pancakes Canned fruit cocktail  Noodles or pasta (no rice) Pudding, mousse, custard  Cooked vegetables (no frozen peas, corn, or mixed vegetables) Green salad  Canned small sweet peas Ice cream  Creamed soup or vegetable soup Fruit ice, [place] ice  Pureed vegetable soup or alphabet soup Non-dairy creamer  Ground scalloped apples Margarine  Gravies Mayonnaise  Sauces:  Cheese, creamed, barbecue, tomato, white Ketchup  Coffee or tea Mustard    SAMPLE MENU:  LEVEL 3 Breakfast Lunch Dinner   Orange juice, 1/2 cup  Oatmeal, 1/2 cup  Scrambled eggs with cheese, 1/2 cup  Decaffeinated tea, 1 cup  Whole milk, 1 cup  Non-dairy creamer, 2 Tbsp  Ketchup, 1 Tbsp  Margarine, 1 tsp  Salt, 1/4 tsp  Sugar, 2 tsp  Pineapple juice, 1/2 cup  Ground beef, 3 oz  Gravy, 2 Tbsp  Mashed potatoes, 1/2 cup  Cooked spinach, 1/2 cup  Applesauce, 1/2 cup  Decaffeinated coffee  Whole milk  Non-dairy creamer, 2 Tbsp  Margarine, 1 tsp  Salt, 1/4 tsp  Pureed [place], barley soup, 3/4 cup  Barbecue chicken, 3 oz  Mashed potatoes, 1/2 cup  Ground fresh broccoli, 1/2 cup  Frozen yogurt, 1/2 cup  Decaffeinated tea, 1 cup  Non-dairy creamer, 2 Tbsp  Margarine, 1 tsp  Salt, 1/4 tsp  Sugar, 1 tsp    LEVEL 4:  REGULAR FOODS  -Foods in this group are soft, moist, regularly textured foods.   -This level includes meat and breads, which tend to be the hardest things to swallow.   -Eat very slowly, chew well and continue to avoid carbonated drinks. -most people are at this level in 4-6 weeks  Hot Foods Cold Foods  Baked fish or skinned Soft cheeses - cottage cheese  Souffles and omelets Cream cheese  Eggs Yogurt  Stuffed shells Milk  Spaghetti with meat sauce Milkshakes  Cooked cereal Cold dry cereals (no nuts, dried fruit, coconut)  [place] toast or pancakes Crackers  Buttered toast Fruit juices or nectars  Noodles or pasta (no rice) Canned fruit  Potatoes (all types) Ripe bananas  Soft, cooked vegetables (no corn, lima, or baked beans) Peeled, ripe, fresh fruit  Creamed soups or vegetable soup Cakes (no nuts, dried fruit, coconut)  Canned chicken noodle soup Plain doughnuts  Gravies Ice cream  Bacon dressing Pudding, mousse, custard  Sauces:  Cheese, creamed, barbecue, tomato, white Fruit ice, [place] ice, sherbet  Decaffeinated tea or coffee Whipped gelatin  Pork chops Regular gelatin   Canned fruited  gelatin molds   Sugar, syrup, honey, jam, jelly   Cream   Non-dairy   Margarine   Oil   Mayonnaise   Ketchup   Mustard   TROUBLESHOOTING IRREGULAR BOWELS  1) Avoid extremes of bowel  movements (no bad constipation/diarrhea)  °2) Miralax 17gm mixed in 8oz. water or juice-daily. May use BID as needed.  °3) Gas-x,Phazyme, etc. as needed for gas & bloating.  °4) Soft,bland diet. No spicy,greasy,fried foods.  °5) Prilosec over-the-counter as needed  °6) May hold gluten/wheat products from diet to see if symptoms improve.  °7) May try probiotics (Align, Activa, etc) to help calm the bowels down  °7) If symptoms become worse call back immediately. ° ° ° °If you have any questions please call our office at [facility]: [phone]. ° °LAPAROSCOPIC SURGERY: POST OP INSTRUCTIONS ° °###################################################################### ° °EAT °Gradually transition to a high fiber diet with a fiber supplement over the next few weeks after discharge.  Start with a pureed / full liquid diet (see below) ° °WALK °Walk an hour a day.  Control your pain to do that.   ° °CONTROL PAIN °Control pain so that you can walk, sleep, tolerate sneezing/coughing, go up/down stairs. ° °HAVE A BOWEL MOVEMENT DAILY °Keep your bowels regular to avoid problems.  OK to try a laxative to override constipation.  OK to use an antidairrheal to slow down diarrhea.  Call if not better after 2 tries ° °CALL IF YOU HAVE PROBLEMS/CONCERNS °Call if you are still struggling despite following these instructions. °Call if you have concerns not answered by these instructions ° °###################################################################### ° ° ° °1. DIET: Follow a light bland diet the first 24 hours after arrival home, such as soup, liquids, crackers, etc.  Be sure to include lots of fluids daily.  Avoid fast food or heavy meals as your are more likely to get nauseated.  Eat a low fat the next few days after  surgery.   °2. Take your usually prescribed home medications unless otherwise directed. °3. PAIN CONTROL: °a. Pain is best controlled by a usual combination of three different methods TOGETHER: °i. Ice/Heat °ii. Over the counter pain medication °iii. Prescription pain medication °b. Most patients will experience some swelling and bruising around the incisions.  Ice packs or heating pads (30-60 minutes up to 6 times a day) will help. Use ice for the first few days to help decrease swelling and bruising, then switch to heat to help relax tight/sore spots and speed recovery.  Some people prefer to use ice alone, heat alone, alternating between ice & heat.  Experiment to what works for you.  Swelling and bruising can take several weeks to resolve.   °c. It is helpful to take an over-the-counter pain medication regularly for the first few weeks.  Choose one of the following that works best for you: °i. Naproxen (Aleve, etc)  Two 220mg tabs twice a day °ii. Ibuprofen (Advil, etc) Three 200mg tabs four times a day (every meal & bedtime) °iii. Acetaminophen (Tylenol, etc) 500-650mg four times a day (every meal & bedtime) °d. A  prescription for pain medication (such as oxycodone, hydrocodone, etc) should be given to you upon discharge.  Take your pain medication as prescribed.  °i. If you are having problems/concerns with the prescription medicine (does not control pain, nausea, vomiting, rash, itching, etc), please call us [phone] to see if we need to switch you to a different pain medicine that will work better for you and/or control your side effect better. °ii. If you need a refill on your pain medication, please contact your pharmacy.  They will contact our office to request authorization. Prescriptions will not be filled after 5 pm or on week-ends. °4. Avoid getting   constipated.  Between the surgery and the pain medications, it is common to experience some constipation.  Increasing fluid intake and taking a  fiber supplement (such as Metamucil, Citrucel, FiberCon, MiraLax, etc) 1-2 times a day regularly will usually help prevent this problem from occurring.  A mild laxative (prune juice, Milk of Magnesia, MiraLax, etc) should be taken according to package directions if there are no bowel movements after 48 hours.   5. Watch out for diarrhea.  If you have many loose bowel movements, simplify your diet to bland foods & liquids for a few days.  Stop any stool softeners and decrease your fiber supplement.  Switching to mild anti-diarrheal medications (Kayopectate, Pepto Bismol) can help.  If this worsens or does not improve, please call us. 6. Wash / shower every day.  You may shower over the dressings as they are waterproof.  Continue to shower over incision(s) after the dressing is off. 7. Remove your waterproof bandages 5 days after surgery.  You may leave the incision open to air.  You may replace a dressing/Band-Aid to cover the incision for comfort if you wish.  8. ACTIVITIES as tolerated:   a. You may resume regular (light) daily activities beginning the next day--such as daily self-care, walking, climbing stairs--gradually increasing activities as tolerated.  If you can walk 30 minutes without difficulty, it is safe to try more intense activity such as jogging, treadmill, bicycling, low-impact aerobics, swimming, etc. b. Save the most intensive and strenuous activity for last such as sit-ups, heavy lifting, contact sports, etc  Refrain from any heavy lifting or straining until you are off narcotics for pain control.   c. DO NOT PUSH THROUGH PAIN.  Let pain be your guide: If it hurts to do something, don't do it.  Pain is your body warning you to avoid that activity for another week until the pain goes down. d. You may drive when you are no longer taking prescription pain medication, you can comfortably wear a seatbelt, and you can safely maneuver your car and apply brakes. e. Dennis Bast may have sexual intercourse  when it is comfortable.  9. FOLLOW UP in our office a. Please call [facility] at [phone] to set up an appointment to see your surgeon in the office for a follow-up appointment approximately 2-3 weeks after your surgery. b. Make sure that you call for this appointment the day you arrive home to insure a convenient appointment time. 10. IF YOU HAVE DISABILITY OR FAMILY LEAVE FORMS, BRING THEM TO THE OFFICE FOR PROCESSING.  DO NOT GIVE THEM TO YOUR DOCTOR.   WHEN TO CALL us [phone]: 1. Poor pain control 2. Reactions / problems with new medications (rash/itching, nausea, etc)  3. Fever over 101.5 F (38.5 C) 4. Inability to urinate 5. Nausea and/or vomiting 6. Worsening swelling or bruising 7. Continued bleeding from incision. 8. Increased pain, redness, or drainage from the incision   The clinic staff is available to answer your questions during regular business hours (8:30am-5pm).  Please dont hesitate to call and ask to speak to one of our nurses for clinical concerns.   If you have a medical emergency, go to the nearest emergency room or call 911.  A surgeon from [facility] is always on call at the [facility], [place], [place], [place], [place]  [id number] ? MAIN: [phone] ? TOLL FREE: [phone] ?  FAX [phone] [email or web address]   Hiatal Hernia  A hiatal hernia occurs when part of the stomach slides above the muscle that separates the abdomen from the chest (diaphragm). A person can be born with a hiatal hernia (congenital), or it may develop over time. In almost all cases of hiatal hernia, only the top part of the stomach pushes through the diaphragm. Many people have a hiatal hernia with no symptoms. The larger the hernia, the more likely it is that you will have symptoms. In some cases, a hiatal hernia allows stomach acid to flow back into the tube that carries food from your mouth to your  stomach (esophagus). This may cause heartburn symptoms. Severe heartburn symptoms may mean that you have developed a condition called gastroesophageal reflux disease (GERD). What are the causes? This condition is caused by a weakness in the opening (hiatus) where the esophagus passes through the diaphragm to attach to the upper part of the stomach. A person may be born with a weakness in the hiatus, or a weakness can develop over time. What increases the risk? This condition is more likely to develop in:  Older people. Age is a major risk factor for a hiatal hernia, especially if you are over the age of 32.  Pregnant women.  People who are overweight.  People who have frequent constipation.  What are the signs or symptoms? Symptoms of this condition usually develop in the form of GERD symptoms. Symptoms include:  Heartburn.  Belching.  Indigestion.  Trouble swallowing.  Coughing or wheezing.  Sore throat.  Hoarseness.  Chest pain.  Nausea and vomiting.  How is this diagnosed? This condition may be diagnosed during testing for GERD. Tests that may be done include:  X-rays of your stomach or chest.  An upper gastrointestinal (GI) series. This is an X-ray exam of your GI tract that is taken after you swallow a chalky liquid that shows up clearly on the X-ray.  Endoscopy. This is a procedure to look into your stomach using a thin, flexible tube that has a tiny camera and light on the end of it.  How is this treated? This condition may be treated by:  Dietary and lifestyle changes to help reduce GERD symptoms.  Medicines. These may include: ? Over-the-counter antacids. ? Medicines that make your stomach empty more quickly. ? Medicines that block the production of stomach acid (H2 blockers). ? Stronger medicines to reduce stomach acid (proton pump inhibitors).  Surgery to repair the hernia, if other treatments are not helping.  If you have no symptoms, you may not  need treatment. Follow these instructions at home: Lifestyle and activity  Do not use any products that contain nicotine or tobacco, such as cigarettes and e-cigarettes. If you need help quitting, ask your health care provider.  Try to achieve and maintain a healthy body weight.  Avoid putting pressure on your abdomen. Anything that puts pressure on your abdomen increases the amount of acid that may be pushed up into your esophagus. ? Avoid bending over, especially after eating. ? Raise the head of your bed by putting blocks under the legs. This keeps your head and esophagus higher than your stomach. ? Do not wear tight clothing around your chest or stomach. ? Try not to strain when having a bowel movement, when urinating, or when lifting heavy objects. Eating and drinking  Avoid foods that can worsen GERD symptoms. These may include: ? Fatty foods, like fried foods. ? Citrus fruits, like oranges or lemon. ? Other foods and drinks that contain  acid, like orange juice or tomatoes. ? Spicy food. ? Chocolate.  Eat frequent small meals instead of three large meals a day. This helps prevent your stomach from getting too full. ? Eat slowly. ? Do not lie down right after eating. ? Do not eat 1-2 hours before bed.  Do not drink beverages with caffeine. These include cola, coffee, cocoa, and tea.  Do not drink alcohol. General instructions  Take over-the-counter and prescription medicines only as told by your health care provider.  Keep all follow-up visits as told by your health care provider. This is important. Contact a health care provider if:  Your symptoms are not controlled with medicines or lifestyle changes.  You are having trouble swallowing.  You have coughing or wheezing that will not go away. Get help right away if:  Your pain is getting worse.  Your pain spreads to your arms, neck, jaw, teeth, or back.  You have shortness of breath.  You sweat for no  reason.  You feel sick to your stomach (nauseous) or you vomit.  You vomit blood.  You have bright red blood in your stools.  You have black, tarry stools. This information is not intended to replace advice given to you by your health care provider. Make sure you discuss any questions you have with your health care provider. Document Released: 03/07/2004 Document Revised: 12/09/2016 Document Reviewed: 12/09/2016 Elsevier Interactive Patient Education  [employer].

## 2018-05-14 ENCOUNTER — Encounter (HOSPITAL_COMMUNITY): Payer: Self-pay | Admitting: Surgery

## 2018-05-14 ENCOUNTER — Inpatient Hospital Stay (HOSPITAL_COMMUNITY): Payer: Medicare Other

## 2018-05-14 DIAGNOSIS — K8044 Calculus of bile duct with chronic cholecystitis without obstruction: Secondary | ICD-10-CM | POA: Diagnosis not present

## 2018-05-14 DIAGNOSIS — K8010 Calculus of gallbladder with chronic cholecystitis without obstruction: Secondary | ICD-10-CM

## 2018-05-14 MED ORDER — IOPAMIDOL (ISOVUE-300) INJECTION 61%
INTRAVENOUS | Status: AC
  Administered 2018-05-14: 40 mL via ORAL
  Filled 2018-05-14: qty 150

## 2018-05-14 MED ORDER — OXYCODONE HCL 5 MG PO TABS: 5.0000 mg | ORAL_TABLET | Freq: Four times a day (QID) | ORAL | 0 refills | Status: DC | PRN

## 2018-05-14 NOTE — Discharge Summary (Signed)
Physician Discharge Summary  Patient ID: Ronald Mccormick MRN: [id number] DOB/AGE: [birth year]  72 y.o.  Admit date: 05/13/2018 Discharge date: 05/14/2018  Patient Care Team: Christain Sacramento, MD as PCP - General (Family Medicine) Michael Boston, MD as Consulting Physician (General Surgery) Armbruster, Carlota Raspberry, MD as Consulting Physician (Gastroenterology)  Discharge Diagnoses:  Principal Problem:   Hiatal hernia s/p robotic repair with partial fundoplication 0/62/6948 Active Problems:   GERD   Chronic cholecystitis with calculus s/p lap cholecystectomy 05/13/2018   1 Day Post-Op  05/13/2018  POST-OPERATIVE DIAGNOSIS:   HIATAL HERNIA WITH REFLUX, CHRONIC CHLOECYSTITIS  SURGERY:  05/13/2018  Procedure(s): XI ROBOTIC HIATAL HERNIA REPAIR,TOUPET FUNDOPLICATION ROBOTIC ASSISTED LAPAROSCOPIC CHOLECYSTECTOMY  SURGEON:    Surgeon(s): Michael Boston, MD Carlena Hurl, PA-C Leighton Ruff, MD  Consults: None  Hospital Course:   The patient underwent the surgery above.  Postoperatively, the patient gradually mobilized.  Esophagogram showed no leak/obstruction.  He was advanced to a pureed diet.  Pain and other symptoms were treated aggressively.    By the time of discharge, the patient was walking well the hallways, eating food, having flatus.  Pain was well-controlled on an oral medications.  Based on meeting discharge criteria and continuing to recover, I felt it was safe for the patient to be discharged from the hospital to further recover with close followup. Postoperative recommendations were discussed in detail.  They are written as well.  Discharged Condition: good  Disposition:  Follow-up Information    Michael Boston, MD. Schedule an appointment as soon as possible for a visit in 3 weeks.   Specialty:  General Surgery Why:  To follow up after your operation, To follow up after your hospital stay Contact information: [place]  [address] [phone]           Discharge disposition: 01-Home or Self Care       Discharge Instructions    Call MD for:   Complete by:  As directed    Temperature > 101.32F   Call MD for:   Complete by:  As directed    Temperature > 101.32F   Call MD for:  extreme fatigue   Complete by:  As directed    Call MD for:  extreme fatigue   Complete by:  As directed    Call MD for:  hives   Complete by:  As directed    Call MD for:  hives   Complete by:  As directed    Call MD for:  persistant nausea and vomiting   Complete by:  As directed    Call MD for:  persistant nausea and vomiting   Complete by:  As directed    Call MD for:  redness, tenderness, or signs of infection (pain, swelling, redness, odor or green/yellow discharge around incision site)   Complete by:  As directed    Call MD for:  redness, tenderness, or signs of infection (pain, swelling, redness, odor or green/yellow discharge around incision site)   Complete by:  As directed    Call MD for:  severe uncontrolled pain   Complete by:  As directed    Call MD for:  severe uncontrolled pain   Complete by:  As directed    Diet general   Complete by:  As directed    SEE ESOPHAGEAL SURGERY DIET INSTRUCTIONS  We using usually start you out on a pureed (blenderized) diet. Expect some sticking with swallowing over the next 1-2 months.  This is due to swelling around your esophagus at the wrap & hiatal diaphragm repair.  It will gradually ease off over the next few months.   Diet general   Complete by:  As directed    SEE ESOPHAGEAL SURGERY DIET INSTRUCTIONS  We using usually start you out on a pureed (blenderized) diet. Expect some sticking with swallowing over the next 1-2 months.   This is due to swelling around your esophagus at the wrap & hiatal diaphragm repair.  It will gradually ease off over the next few months.   Discharge instructions   Complete by:  As directed    Please see discharge instruction  sheets.   Also refer to any handouts/printouts that may have been given from the [facility] surgery office (if you visited [place] there before surgery) Please call our office if you have any questions or concerns [phone]   Discharge instructions   Complete by:  As directed    Please see discharge instruction sheets.   Also refer to any handouts/printouts that may have been given from the [facility] surgery office (if you visited [place] there before surgery) Please call our office if you have any questions or concerns [phone]   Driving Restrictions   Complete by:  As directed    No driving until off narcotics and can safely swerve away without pain during an emergency   Driving Restrictions   Complete by:  As directed    No driving until off narcotics and can safely swerve away without pain during an emergency   Increase activity slowly   Complete by:  As directed    Increase activity slowly   Complete by:  As directed    Lifting restrictions   Complete by:  As directed    Avoid heavy lifting initially, <20 pounds at first.   Do not push through pain.   You have no specific weight limit: If it hurts to do, DON'T DO IT.    If you feel no pain, you are not injuring anything.  Pain will protect you from injury.   Coughing and sneezing are far more stressful to your incision than any lifting.   Avoid resuming heavy lifting (>50 pounds) or other intense activity until off all narcotic pain medications.   When want to exercise more, give yourself 2 weeks to gradually get back to full intense exercise/activity.   Lifting restrictions   Complete by:  As directed    Avoid heavy lifting initially, <20 pounds at first.   Do not push through pain.   You have no specific weight limit: If it hurts to do, DON'T DO IT.    If you feel no pain, you are not injuring anything.  Pain will protect you from injury.   Coughing and sneezing are far more stressful to your incision than any lifting.   Avoid resuming  heavy lifting (>50 pounds) or other intense activity until off all narcotic pain medications.   When want to exercise more, give yourself 2 weeks to gradually get back to full intense exercise/activity.   May shower / Bathe   Complete by:  As directed    [place].  It is fine for dressings or wounds to be washed/rinsed.  Use gentle soap & water.  This will help the incisions and/or wounds get clean & minimize infection.   May shower / Bathe   Complete by:  As directed    [place].  It is fine for  dressings or wounds to be washed/rinsed.  Use gentle soap & water.  This will help the incisions and/or wounds get clean & minimize infection.   May walk up steps   Complete by:  As directed    May walk up steps   Complete by:  As directed    Remove dressing in 72 hours   Complete by:  As directed    You have closed incisions: Shower and bathe over these incisions with soap and water every day.  It is OK to wash over the dressings: they are waterproof. Remove all surgical dressings on postoperative day #3.  You do not need to replace dressings over the closed incisions unless you feel more comfortable with a Band-Aid covering it.   Please call our office [phone] if you have further questions.   Remove dressing in 72 hours   Complete by:  As directed    You have closed incisions: Shower and bathe over these incisions with soap and water every day.  It is OK to wash over the dressings: they are waterproof. Remove all surgical dressings on postoperative day #3.  You do not need to replace dressings over the closed incisions unless you feel more comfortable with a Band-Aid covering it.   Please call our office [phone] if you have further questions.   Sexual Activity Restrictions   Complete by:  As directed    Sexual activity as tolerated.  Do not push through pain.  Pain will protect you from injury.   Sexual Activity Restrictions   Complete by:  As directed    Sexual  activity as tolerated.  Do not push through pain.  Pain will protect you from injury.   Walk with assistance   Complete by:  As directed    Walk over an hour a day.  May use a walker/cane/companion to help with balance and stamina.   Walk with assistance   Complete by:  As directed    Walk over an hour a day.  May use a walker/cane/companion to help with balance and stamina.      Allergies as of 05/14/2018   No Known Allergies     Medication List    TAKE these medications   DEXILANT 60 MG capsule Generic drug:  dexlansoprazole TAKE 1 CAPSULE DAILY What changed:    how much to take  how to take this  when to take this   multivitamin tablet Take 1 tablet by mouth daily.   ondansetron 4 MG tablet Commonly known as:  ZOFRAN Take 1 tablet (4 mg total) by mouth every 8 (eight) hours as needed for nausea.   oxyCODONE 5 MG immediate release tablet Commonly known as:  Oxy IR/ROXICODONE Take 1-2 tablets (5-10 mg total) by mouth every 6 (six) hours as needed for moderate pain, severe pain or breakthrough pain.   promethazine 25 MG suppository Commonly known as:  PHENERGAN Place 1 suppository (25 mg total) rectally every 6 (six) hours as needed for nausea.   SUPER B COMPLEX PO Take 1 tablet by mouth daily.       Significant Diagnostic Studies:  No results found for this or any previous visit (from the past 72 hour(s)).  No results found.  Discharge Exam: Blood pressure (!) 146/95, pulse 75, temperature (!) 97.4 F (36.3 C), temperature source Oral, resp. rate 18, height 5\' 10"  (1.778 m), weight 80.3 kg (177 lb), SpO2 94 %.  General: Pt awake/alert/oriented x4 in No acute distress Eyes: PERRL, normal  EOM.  Sclera clear.  No icterus Neuro: CN II-XII intact w/o focal sensory/motor deficits. Lymph: No head/neck/groin lymphadenopathy Psych:  No delerium/psychosis/paranoia HENT: Normocephalic, Mucus membranes moist.  No thrush Neck: Supple, No tracheal deviation Chest:  No chest wall pain w good excursion CV:  Pulses intact.  Regular rhythm MS: Normal AROM mjr joints.  No obvious deformity Abdomen: Soft.  Nondistended.  Nontender.  No evidence of peritonitis.  No incarcerated hernias. Ext:  SCDs BLE.  No mjr edema.  No cyanosis Skin: No petechiae / purpura  Past Medical History:  Diagnosis Date  . Chronic cholecystitis   . GERD (gastroesophageal reflux disease)   . Gouty arthritis of right great toe    finished prednisone treatment 05-05-2018--  per pt resolved  . Hiatal hernia   . History of colon polyps   . HOH (hard of hearing)     Past Surgical History:  Procedure Laterality Date  . COLONOSCOPY  08/2013  . ESOPHAGEAL MANOMETRY N/A 04/10/2018   Procedure: ESOPHAGEAL MANOMETRY (EM);  Surgeon: Mauri Pole, MD;  Location: [facility];  Service: Endoscopy;  Laterality: N/A;  . LEFT HEART CATHETERIZATION WITH CORONARY/GRAFT ANGIOGRAM N/A 05/24/2014   Procedure: LEFT HEART CATHETERIZATION WITH Beatrix Fetters;  Surgeon: Birdie Riddle, MD;  Location: [facility];  Service: Cardiovascular;  Laterality: N/A; minimal luminal irreglarities involving pLAD and LCFx, otherwise normal coronary arteries, normal LVSF (ef 60-65%)and LVEDP (54mmHg)  . STAPEDES SURGERY Right 1986  . TONSILLECTOMY  1954  . UPPER GASTROINTESTINAL ENDOSCOPY  03/2018    Social History   Socioeconomic History  . Marital status: Married    Spouse name: Not on file  . Number of children: 3  . Years of education: Not on file  . Highest education level: Not on file  Occupational History  . Occupation: retired    Fish farm manager: [employer]  Social Needs  . Financial resource strain: Not on file  . Food insecurity:    Worry: Not on file    Inability: Not on file  . Transportation needs:    Medical: Not on file    Non-medical: Not on file  Tobacco Use  . Smoking status: Former Smoker    Years: 3.00    Types: Pipe    Start date: 12/30/1978    Last attempt to quit:  05/05/1984    Years since quitting: 34.0  . Smokeless tobacco: Never Used  Substance and Sexual Activity  . Alcohol use: Yes    Alcohol/week: 2.4 oz    Types: 4 Cans of beer per week  . Drug use: No  . Sexual activity: Not on file  Lifestyle  . Physical activity:    Days per week: Not on file    Minutes per session: Not on file  . Stress: Not on file  Relationships  . Social connections:    Talks on phone: Not on file    Gets together: Not on file    Attends religious service: Not on file    Active member of club or organization: Not on file    Attends meetings of clubs or organizations: Not on file    Relationship status: Not on file  . Intimate partner violence:    Fear of current or ex partner: Not on file    Emotionally abused: Not on file    Physically abused: Not on file    Forced sexual activity: Not on file  Other Topics Concern  . Not on file  Social History Narrative  .  Not on file    Family History  Problem Relation Age of Onset  . Colon cancer Father 76  . Esophageal cancer Father   . Colon polyps Father   . Hypertension Mother   . Stroke Mother   . Cancer Maternal Grandfather        mets  . Rectal cancer Neg Hx   . Stomach cancer Neg Hx     Current Facility-Administered Medications  Medication Dose Route Frequency Provider Last Rate Last Dose  . 0.9 %  sodium chloride infusion  250 mL Intravenous PRN Michael Boston, MD      . acetaminophen (TYLENOL) tablet 1,000 mg  1,000 mg Oral Tor Netters, MD   1,000 mg at 05/14/18 0525  . alum & mag hydroxide-simeth (MAALOX/MYLANTA) 200-200-20 MG/5ML suspension 30 mL  30 mL Oral Q6H PRN Michael Boston, MD   30 mL at 05/13/18 2321  . bisacodyl (DULCOLAX) suppository 10 mg  10 mg Rectal Daily PRN Michael Boston, MD      . diphenhydrAMINE (BENADRYL) 12.5 MG/5ML elixir 12.5 mg  12.5 mg Oral Q6H PRN Michael Boston, MD       Or  . diphenhydrAMINE (BENADRYL) injection 12.5 mg  12.5 mg Intravenous Q6H PRN Michael Boston,  MD      . enalaprilat (VASOTEC) injection 0.625-1.25 mg  0.625-1.25 mg Intravenous Q6H PRN Michael Boston, MD      . enoxaparin (LOVENOX) injection 40 mg  40 mg Subcutaneous Q24H Michael Boston, MD      . feeding supplement (ENSURE SURGERY) liquid 237 mL  237 mL Oral BID BM Michael Boston, MD   237 mL at 05/13/18 2203  . gabapentin (NEURONTIN) capsule 300 mg  300 mg Oral BID Michael Boston, MD   300 mg at 05/13/18 2203  . guaiFENesin-dextromethorphan (ROBITUSSIN DM) 100-10 MG/5ML syrup 10 mL  10 mL Oral Q4H PRN Michael Boston, MD      . hydrALAZINE (APRESOLINE) injection 5-20 mg  5-20 mg Intravenous Q4H PRN Michael Boston, MD      . hydrocortisone (ANUSOL-HC) 2.5 % rectal cream 1 application  1 application Topical QID PRN Michael Boston, MD      . hydrocortisone cream 1 % 1 application  1 application Topical TID PRN Michael Boston, MD      . HYDROmorphone (DILAUDID) injection 0.5-2 mg  0.5-2 mg Intravenous Q2H PRN Michael Boston, MD      . lactated ringers bolus 1,000 mL  1,000 mL Intravenous Q8H PRN , Remo Lipps, MD      . lactated ringers infusion 1,000 mL  1,000 mL Intravenous Q8H PRN , Remo Lipps, MD      . lip balm (CARMEX) ointment 1 application  1 application Topical BID Michael Boston, MD   1 application at 52/84/13 2203  . magic mouthwash  15 mL Oral QID PRN Michael Boston, MD      . menthol-cetylpyridinium (CEPACOL) lozenge 3 mg  1 lozenge Oral PRN Michael Boston, MD      . methocarbamol (ROBAXIN) tablet 750 mg  750 mg Oral Q6H PRN Michael Boston, MD      . metoCLOPramide (REGLAN) injection 10 mg  10 mg Intravenous Q6H PRN Michael Boston, MD      . metoCLOPramide (REGLAN) tablet 10 mg  10 mg Oral Q6H PRN Michael Boston, MD      . metoprolol tartrate (LOPRESSOR) injection 5 mg  5 mg Intravenous Q6H PRN Michael Boston, MD      . ondansetron (ZOFRAN-ODT) disintegrating tablet  4 mg  4 mg Oral Q6H PRN Michael Boston, MD       Or  . ondansetron [facility]) injection 4 mg  4 mg Intravenous Q6H PRN Michael Boston, MD      . oxyCODONE (Oxy IR/ROXICODONE) immediate release tablet 5-10 mg  5-10 mg Oral Q4H PRN Michael Boston, MD   10 mg at 05/13/18 2321  . pantoprazole (PROTONIX) EC tablet 40 mg  40 mg Oral Daily Michael Boston, MD      . phenol (CHLORASEPTIC) mouth spray 1-2 spray  1-2 spray Mouth/Throat PRN Michael Boston, MD      . polyethylene glycol (MIRALAX / GLYCOLAX) packet 17 g  17 g Oral Daily PRN Michael Boston, MD      . prochlorperazine (COMPAZINE) tablet 10 mg  10 mg Oral Q6H PRN Michael Boston, MD       Or  . prochlorperazine (COMPAZINE) injection 5-10 mg  5-10 mg Intravenous Q6H PRN Michael Boston, MD      . simethicone (MYLICON) chewable tablet 40 mg  40 mg Oral Q6H PRN Michael Boston, MD      . sodium chloride flush (NS) 0.9 % injection 3 mL  3 mL Intravenous Gorden Harms, MD   3 mL at 05/13/18 2204  . sodium chloride flush (NS) 0.9 % injection 3 mL  3 mL Intravenous PRN Michael Boston, MD         No Known Allergies  Signed: Morton Peters, M.D., F.A.C.S. Gastrointestinal and Minimally Invasive Surgery [facility] [address], [place], [place] [address] [phone] Main / Paging   05/14/2018, 7:44 AM

## 2018-05-14 NOTE — Care Management CC44 (Signed)
Condition Code 44 Documentation Completed  Patient Details  Name: Ronald Mccormick MRN: [id number] Date of Birth: March 10, 1946   Condition Code 44 given:  Yes Patient signature on Condition Code 44 notice:  Yes Documentation of 2 MD's agreement:  Yes Code 44 added to claim:  Yes    Guadalupe Maple, RN 05/14/2018, 12:32 PM

## 2018-05-14 NOTE — Care Management Obs Status (Signed)
[place] NOTIFICATION   Patient Details  Name: Ronald Mccormick MRN: [id number] Date of Birth: 06-10-1946   Medicare Observation Status Notification Given:  Yes    Guadalupe Maple, RN 05/14/2018, 12:32 PM

## 2018-05-14 NOTE — Plan of Care (Signed)
Nutrition Education Note  RD consulted for nutrition education regarding patient who is s/p gastric fundoplication.  RD provided handout on Nissen Fundoplication nutrition therapy. Reviewed clear and full liquids. Encouraged pt to avoid caffeine, carbonated beverages and use of straws.  Provided examples of appropriate foods on a soft diet. Reviewed gas producing foods. Discouraged intake of processed foods and red meat. Recommended use of a liquid multivitamin while following a liquid diet. Reviewed acceptable protein supplements.  RD discussed why it is important for patient to adhere to diet recommendations. Teach back method used.  Expect good compliance.   Body mass index is 25.4 kg/m.  Pt meets criteria for normal based on current BMI.  Current diet order is dysphagia 1 diet. Labs and medications reviewed. No further nutrition interventions warranted at this time. If additional nutrition issues arise, please re-consult RD.   Clayton Bibles, MS, RD, [place] Dietitian Pager: [phone] After Hours Pager: [phone]

## 2018-05-14 NOTE — Progress Notes (Signed)
[facility]  [address] [place], [place] [address] Phone: [phone]  FAX: [place] [phone] 1946/09/03  CARE TEAM:  PCP: Christain Sacramento, MD  Outpatient Care Team: Patient Care Team: Christain Sacramento, MD as PCP - General (Family Medicine) Michael Boston, MD as Consulting Physician (General Surgery) Armbruster, Carlota Raspberry, MD as Consulting Physician (Gastroenterology)  Inpatient Treatment Team: Treatment Team: Attending Provider: Michael Boston, MD; Registered Nurse: Sunny Schlein, RN   Problem List:   Principal Problem:   Hiatal hernia s/p robotic repair with partial fundoplication 0/35/4656 Active Problems:   GERD   Chronic cholecystitis with calculus s/p lap cholecystectomy 05/13/2018   1 Day Post-Op  05/13/2018  POST-OPERATIVE DIAGNOSIS:  HIATAL HERNIA WITH REFLUX, CHRONIC CHLOECYSTITIS  PROCEDURE:   1. ROBOTIC reduction of paraesophageal hiatal hernia 2. Type II mediastinal dissection. 3. Primary repair of hiatal hernia over pledgets.  4. Anterior & posterior gastropexy. 5. Toupet(posterior 812 degree)  fundoplication x4 cm  6. Mesh reinforcement with absorbable mesh 7. ROBOTIC cholecystectomy  SURGEON:  [name], MD   Assessment  Recovering well  Plan:  -Esophagram this morning.  If it shows no leak or obstruction, advance to pured diet and remove drain. -VTE prophylaxis- SCDs, etc -mobilize as tolerated to help recovery  D/C patient from hospital when patient meets criteria (anticipate in 0-1 day(s)):  Tolerating oral intake well Ambulating well Adequate pain control without IV medications Urinating  Having flatus Disposition planning in place   20 minutes spent in review, evaluation, examination, counseling, and coordination of care.  More than 50% of that time was spent in counseling.  [name], M.D., F.A.C.S. Gastrointestinal and Minimally  Invasive Surgery [facility] [address] [place], [place] [address] [phone] Main / Paging   05/14/2018    Subjective: (Chief complaint)  Feels great.  Denies pain.  Tolerating liquids.  Minimal drain output  Objective:  Vital signs:  Vitals:   05/13/18 2218 05/13/18 2329 05/14/18 0212 05/14/18 0545  BP: (!) 163/98 (!) 166/93 (!) 135/91 (!) 146/95  Pulse: 75 81 82 75  Resp: 18 18 16 18   Temp: 98 F (36.7 C) 97.9 F (36.6 C) (!) 97.4 F (36.3 C)   TempSrc: Oral Oral Oral   SpO2: 97% 97% 92% 94%  Weight:      Height:        Last BM Date: 05/13/18  Intake/Output   Yesterday:  05/15 0701 - 05/16 0700 In: 2121.3 [P.O.:520; I.V.:1601.3] Out: 278 [Urine:200; Drains:28; Blood:50] This shift:  Total I/O In: 821.3 [P.O.:520; I.V.:301.3] Out: 228 [Urine:200; Drains:28]  Bowel function:  Flatus: YES  BM:  No  Drain: Serosanguinous   Physical Exam:  General: Pt awake/alert/oriented x4 in no acute distress Eyes: PERRL, normal EOM.  Sclera clear.  No icterus Neuro: CN II-XII intact w/o focal sensory/motor deficits. Lymph: No head/neck/groin lymphadenopathy Psych:  No delerium/psychosis/paranoia HENT: Normocephalic, Mucus membranes moist.  No thrush Neck: Supple, No tracheal deviation Chest: No chest wall pain w good excursion CV:  Pulses intact.  Regular rhythm MS: Normal AROM mjr joints.  No obvious deformity  Abdomen: Soft.  Nondistended.  Nontender.  No evidence of peritonitis.  No incarcerated hernias.  Ext:  No deformity.  No mjr edema.  No cyanosis Skin: No petechiae / purpura  Results:   Labs: No results found for this or any previous visit (from the  past 48 hour(s)).  Imaging / Studies: No results found.  Medications / Allergies: per chart  Antibiotics: Anti-infectives (From admission, onward)   Start     Dose/Rate Route Frequency Ordered Stop   05/13/18 1400  metroNIDAZOLE (FLAGYL) IVPB 500 mg      500 mg 100 mL/hr over 60 Minutes Intravenous  Once 05/13/18 1345 05/13/18 1535   05/13/18 1345  cefTRIAXone (ROCEPHIN) 2 g in sodium chloride 0.9 % 100 mL IVPB     2 g 200 mL/hr over 30 Minutes Intravenous  Once 05/13/18 1343 05/13/18 1500   05/13/18 1344  sodium chloride 0.9 % with cefTRIAXone (ROCEPHIN) ADS Med    Note to Pharmacy:  Randa Evens  : cabinet override      05/13/18 1344 05/13/18 1445        Note: Portions of this report may have been transcribed using voice recognition software. Every effort was made to ensure accuracy; however, inadvertent computerized transcription errors may be present.   Any transcriptional errors that result from this process are unintentional.     [name], M.D., F.A.C.S. Gastrointestinal and Minimally Invasive Surgery [facility] [address], [place], [place] [address] [phone] Main / Paging   05/14/2018

## 2018-06-01 ENCOUNTER — Other Ambulatory Visit: Payer: Self-pay

## 2018-06-01 ENCOUNTER — Ambulatory Visit (AMBULATORY_SURGERY_CENTER): Payer: Self-pay | Admitting: *Deleted

## 2018-06-01 VITALS — Ht 70.0 in | Wt 168.0 lb

## 2018-06-01 DIAGNOSIS — K317 Polyp of stomach and duodenum: Secondary | ICD-10-CM

## 2018-06-01 NOTE — Progress Notes (Signed)
Patient denies any allergies to eggs or soy. Patient denies any problems with anesthesia/sedation. Patient denies any oxygen use at home. Patient denies taking any diet/weight loss medications or blood thinners. EMMI education offered, pt declined. Pt's states no medical or surgical changes since last procedure.

## 2018-06-02 ENCOUNTER — Telehealth: Payer: Self-pay | Admitting: Gastroenterology

## 2018-06-02 NOTE — Telephone Encounter (Signed)
Okay that sounds fine. Thanks

## 2018-06-02 NOTE — Telephone Encounter (Signed)
Spoke to patient let him know that Dr. Havery Moros okay to rescheduling this EGD in August. August [facility] schedule is not available yet, told patient that when available we would reschedule. I put in a recall for 6/24 to notify patient and schedule. I called Sharee Pimple in scheduling and cancelled procedure.

## 2018-06-02 NOTE — Telephone Encounter (Signed)
August hospital schedule is not out yet, will put him on recall list.

## 2018-06-16 ENCOUNTER — Ambulatory Visit (HOSPITAL_COMMUNITY): Admit: 2018-06-16 | Payer: Medicare Other | Admitting: Gastroenterology

## 2018-06-16 ENCOUNTER — Encounter (HOSPITAL_COMMUNITY): Payer: Self-pay

## 2018-06-16 SURGERY — ESOPHAGOGASTRODUODENOSCOPY (EGD) WITH PROPOFOL
Anesthesia: Monitor Anesthesia Care

## 2018-06-23 ENCOUNTER — Telehealth: Payer: Self-pay | Admitting: Gastroenterology

## 2018-06-23 NOTE — Telephone Encounter (Signed)
Patient scheduled for EGD and PV, sent him a MyChart message with information.

## 2018-06-23 NOTE — Telephone Encounter (Signed)
Pt would like to know if the schedule for the hospital is available so he can schedule his proc. Pls call him.

## 2018-08-04 ENCOUNTER — Ambulatory Visit (AMBULATORY_SURGERY_CENTER): Payer: Self-pay

## 2018-08-04 VITALS — Ht 70.0 in | Wt 163.6 lb

## 2018-08-04 DIAGNOSIS — K317 Polyp of stomach and duodenum: Secondary | ICD-10-CM

## 2018-08-04 NOTE — Progress Notes (Signed)
Per pt, no allergies to soy or egg products.Pt not taking any weight loss meds or using  O2 at home.  Pt refused emmi video. 

## 2018-08-23 NOTE — Anesthesia Preprocedure Evaluation (Addendum)
Anesthesia Evaluation  Patient identified by MRN, date of birth, ID band Patient awake    Reviewed: Allergy & Precautions, NPO status , Patient's Chart, lab work & pertinent test results  Airway Mallampati: II  TM Distance: >3 FB Neck ROM: Full    Dental  (+) Dental Advisory Given   Pulmonary former smoker,    breath sounds clear to auscultation       Cardiovascular negative cardio ROS   Rhythm:Regular Rate:Normal     Neuro/Psych negative neurological ROS     GI/Hepatic Neg liver ROS, hiatal hernia, GERD  ,S/p lap nissen   Endo/Other  negative endocrine ROS  Renal/GU Renal disease     Musculoskeletal  (+) Arthritis ,   Abdominal   Peds  Hematology negative hematology ROS (+)   Anesthesia Other Findings   Reproductive/Obstetrics                            Anesthesia Physical Anesthesia Plan  ASA: II  Anesthesia Plan: MAC   Post-op Pain Management:    Induction: Intravenous  PONV Risk Score and Plan: 1 and Propofol infusion, Ondansetron and Treatment may vary due to age or medical condition  Airway Management Planned: Natural Airway and Nasal Cannula  Additional Equipment:   Intra-op Plan:   Post-operative Plan:   Informed Consent: I have reviewed the patients History and Physical, chart, labs and discussed the procedure including the risks, benefits and alternatives for the proposed anesthesia with the patient or authorized representative who has indicated his/her understanding and acceptance.     Plan Discussed with: CRNA  Anesthesia Plan Comments:        Anesthesia Quick Evaluation

## 2018-08-24 ENCOUNTER — Ambulatory Visit (HOSPITAL_COMMUNITY): Payer: Medicare Other | Admitting: Anesthesiology

## 2018-08-24 ENCOUNTER — Ambulatory Visit (HOSPITAL_COMMUNITY)
Admission: RE | Admit: 2018-08-24 | Discharge: 2018-08-24 | Disposition: A | Discharge status: Home / Self Care | Payer: Medicare Other | Source: Ambulatory Visit | Attending: Gastroenterology | Admitting: Gastroenterology

## 2018-08-24 ENCOUNTER — Encounter (HOSPITAL_COMMUNITY): Payer: Self-pay | Admitting: *Deleted

## 2018-08-24 ENCOUNTER — Other Ambulatory Visit: Payer: Self-pay

## 2018-08-24 ENCOUNTER — Encounter (HOSPITAL_COMMUNITY)
Admission: RE | Disposition: A | Discharge status: Home / Self Care | Payer: Self-pay | Source: Ambulatory Visit | Attending: Gastroenterology

## 2018-08-24 DIAGNOSIS — Z9889 Other specified postprocedural states: Secondary | ICD-10-CM | POA: Insufficient documentation

## 2018-08-24 DIAGNOSIS — K3189 Other diseases of stomach and duodenum: Secondary | ICD-10-CM | POA: Diagnosis not present

## 2018-08-24 DIAGNOSIS — Z87891 Personal history of nicotine dependence: Secondary | ICD-10-CM | POA: Insufficient documentation

## 2018-08-24 DIAGNOSIS — K209 Esophagitis, unspecified: Secondary | ICD-10-CM | POA: Insufficient documentation

## 2018-08-24 DIAGNOSIS — Z800 Family history of malignant neoplasm of digestive organs: Secondary | ICD-10-CM | POA: Diagnosis not present

## 2018-08-24 DIAGNOSIS — Z79899 Other long term (current) drug therapy: Secondary | ICD-10-CM | POA: Diagnosis not present

## 2018-08-24 DIAGNOSIS — K317 Polyp of stomach and duodenum: Secondary | ICD-10-CM | POA: Diagnosis not present

## 2018-08-24 HISTORY — PX: POLYPECTOMY: SHX5525

## 2018-08-24 HISTORY — PX: ESOPHAGOGASTRODUODENOSCOPY (EGD) WITH PROPOFOL: SHX5813

## 2018-08-24 SURGERY — ESOPHAGOGASTRODUODENOSCOPY (EGD) WITH PROPOFOL
Anesthesia: Monitor Anesthesia Care

## 2018-08-24 MED ORDER — PROPOFOL 10 MG/ML IV BOLUS
INTRAVENOUS | Status: AC
  Filled 2018-08-24: qty 60

## 2018-08-24 MED ORDER — PROPOFOL 500 MG/50ML IV EMUL
INTRAVENOUS | Status: DC | PRN
  Administered 2018-08-24: 125 ug/kg/min via INTRAVENOUS

## 2018-08-24 MED ORDER — PROPOFOL 10 MG/ML IV BOLUS: INTRAVENOUS | Status: DC | PRN

## 2018-08-24 MED ORDER — PROPOFOL 10 MG/ML IV BOLUS
INTRAVENOUS | Status: DC | PRN
  Administered 2018-08-24: 20 mg via INTRAVENOUS

## 2018-08-24 MED ORDER — LIDOCAINE 2% (20 MG/ML) 5 ML SYRINGE
INTRAMUSCULAR | Status: DC | PRN
  Administered 2018-08-24: 80 mg via INTRAVENOUS

## 2018-08-24 MED ORDER — OMEPRAZOLE 40 MG PO CPDR: 40.0000 mg | DELAYED_RELEASE_CAPSULE | Freq: Every day | ORAL | 0 refills | Status: DC

## 2018-08-24 MED ORDER — LACTATED RINGERS IV SOLN
INTRAVENOUS | Status: DC
  Administered 2018-08-24: 1000 mL via INTRAVENOUS

## 2018-08-24 SURGICAL SUPPLY — 15 items

## 2018-08-24 NOTE — Anesthesia Postprocedure Evaluation (Signed)
Anesthesia Post Note  Patient: Ronald Mccormick  Procedure(s) Performed: ESOPHAGOGASTRODUODENOSCOPY (EGD) WITH PROPOFOL (N/A ) POLYPECTOMY     Anesthesia Post Evaluation  Last Vitals:  Vitals:   08/24/18 0830 08/24/18 0840  BP: 121/61 129/65  Pulse: (!) 54 (!) 49  Resp: (!) 25 20  Temp:    SpO2: 100% 100%    Last Pain:  Vitals:   08/24/18 0840  TempSrc:   PainSc: 0-No pain                 ,

## 2018-08-24 NOTE — Anesthesia Procedure Notes (Signed)
Procedure Name: MAC Date/Time: 08/24/2018 7:57 AM Performed by: Dione Booze, CRNA Pre-anesthesia Checklist: Patient identified, Emergency Drugs available, Suction available and Patient being monitored Patient Re-evaluated:Patient Re-evaluated prior to induction Oxygen Delivery Method: Nasal cannula Placement Confirmation: positive ETCO2

## 2018-08-24 NOTE — Op Note (Signed)
[facility] Patient Name: Rawn Quiroa Procedure Date: 08/24/2018 MRN: [id number] Attending MD: Carlota Raspberry. Havery Moros , MD Date of Birth: December 30, 1946 CSN: [id number] Age: 72 Admit Type: Outpatient Procedure:                Upper GI endoscopy Indications:              For therapy of gastric polyps, now off PPI s/p                            toupet fundoplication and repair of hiatal hernia Providers:                Remo Lipps P. Havery Moros, MD, Carmie End, RN,                            Dione Booze, CRNA Referring MD:              Medicines:                Monitored Anesthesia Care Complications:            No immediate complications. Estimated blood loss:                            Minimal. Estimated Blood Loss:     Estimated blood loss: Minimal. Procedure:                Pre-Anesthesia Assessment:                           - Prior to the procedure, a History and Physical                            was performed, and patient medications and                            allergies were reviewed. The patient's tolerance of                            previous anesthesia was also reviewed. The risks                            and benefits of the procedure and the sedation                            options and risks were discussed with the patient.                            All questions were answered, and informed consent                            was obtained. Prior Anticoagulants: The patient has                            taken no previous anticoagulant or antiplatelet  agents. ASA Grade Assessment: II - A patient with                            mild systemic disease. After reviewing the risks                            and benefits, the patient was deemed in                            satisfactory condition to undergo the procedure.                           After obtaining informed consent, the endoscope was   passed under direct vision. Throughout the                            procedure, the patient's blood pressure, pulse, and                            oxygen saturations were monitored continuously. The                            GIF-H190 ([id number]) Olympus adult endoscope was                            introduced through the mouth, and advanced to the                            second part of duodenum. The upper GI endoscopy was                            accomplished without difficulty. The patient                            tolerated the procedure well. Scope In: Scope Out: Findings:      The Z-line was found 35 cm from the incisors.      Mild esophagitis was found 35 cm from the incisors with a focal area of       erosion inflammation (improved from previous).      The exam of the esophagus was otherwise normal.      Evidence of a Toupet fundoplication was found in the cardia. The wrap       appeared intact.      Multiple 3 to 15 mm sessile polyps were found in the gastric fundus and       in the gastric body. Overall, size of these appeared improved compared       to the previous exam since coming off PPI (previously the largest were       approximately 2cm in size). An area of 2 adjacent polyps roughly 2cm in       size were removed with a hot snare. 3 additional polyps ranging from       8-103m were removed with hot snare. Resection and retrieval were       complete. To prevent bleeding after the polypectomy at the most distal  polyp that was removed, given it had a large base to it, one hemostatic       clip was successfully placed for preventative purposes.      The exam of the stomach was otherwise normal.      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      The exam of the duodenum was otherwise normal. Impression:               - Z-line, 35 cm from the incisors.                            - Very mild esophagitis at the GEJ, improved since                            previous exam post surgery.                           - A Toupet fundoplication was found. The wrap                            appears intact.                           - Multiple gastric polyps. The largest were                            resected and retrieved as above. One prophylactic                            clip was placed to prevent bleeding.                           - Nodular mucosa in the duodenal bulb consistent                            with known ectopic gastric mucosa. Moderate Sedation:      No moderate sedation, case performed with MAC Recommendation:           - Patient has a contact number available for                            emergencies. The signs and symptoms of potential                            delayed complications were discussed with the                            patient. Return to normal activities tomorrow.                            Written discharge instructions were provided to the                            patient.                           -  Soft diet today, regular tomorrow.                           - Continue present medications.                           - Omeprazole 62m once daily for 2 weeks to reduce                            risk for bleeding at polypectomy sites                           - Await pathology results.                           - No ibuprofen, naproxen, or other non-steroidal                            anti-inflammatory drugs for 2 weeks after polyp                            removal. Procedure Code(s):        --- Professional ---                           [id number] Esophagogastroduodenoscopy, flexible,                            transoral; with removal of tumor(s), polyp(s), or                            other lesion(s) by snare technique Diagnosis Code(s):        --- Professional ---                           K20.9, Esophagitis,  unspecified                           Z98.890, Other specified postprocedural states                           K31.7, Polyp of stomach and duodenum                           K31.89, Other diseases of stomach and duodenum CPT copyright 2017 [employer] Association. All rights reserved. The codes documented in this report are preliminary and upon coder review may  be revised to meet current compliance requirements. SRemo LippsP. AHavery Moros MD 08/24/2018 8:29:52 AM This report has been signed electronically. Number of Addenda: 0

## 2018-08-24 NOTE — Discharge Instructions (Signed)
YOU HAD AN ENDOSCOPIC PROCEDURE TODAY: Refer to the procedure report and other information in the discharge instructions given to you for any specific questions about what was found during the examination. If this information does not answer your questions, please call [place] office at [phone] to clarify.   YOU SHOULD EXPECT: Some feelings of bloating in the abdomen. Passage of more gas than usual. Walking can help get rid of the air that was put into your GI tract during the procedure and reduce the bloating. If you had a lower endoscopy (such as a colonoscopy or flexible sigmoidoscopy) you may notice spotting of blood in your stool or on the toilet paper. Some abdominal soreness may be present for a day or two, also.  DIET: Your first meal following the procedure should be a light meal and then it is ok to progress to your normal diet. A half-sandwich or bowl of soup is an example of a good first meal. Heavy or fried foods are harder to digest and may make you feel nauseous or bloated. Drink plenty of fluids but you should avoid alcoholic beverages for 24 hours. If you had a esophageal dilation, please see attached instructions for diet.    ACTIVITY: Your care partner should take you home directly after the procedure. You should plan to take it easy, moving slowly for the rest of the day. You can resume normal activity the day after the procedure however YOU SHOULD NOT DRIVE, use power tools, machinery or perform tasks that involve climbing or major physical exertion for 24 hours (because of the sedation medicines used during the test).   SYMPTOMS TO REPORT IMMEDIATELY: A gastroenterologist can be reached at any hour. Please call [phone]  for any of the following symptoms:   Following upper endoscopy (EGD, EUS, ERCP, esophageal dilation) Vomiting of blood or coffee ground material  New, significant abdominal pain  New, significant chest pain or pain under the shoulder blades  Painful or  persistently difficult swallowing  New shortness of breath  Black, tarry-looking or red, bloody stools  FOLLOW UP:  If any biopsies were taken you will be contacted by phone or by letter within the next 1-3 weeks. Call [phone]  if you have not heard about the biopsies in 3 weeks.  Please also call with any specific questions about appointments or follow up tests.  

## 2018-08-24 NOTE — Transfer of Care (Signed)
Immediate Anesthesia Transfer of Care Note  Patient: Ronald Mccormick  Procedure(s) Performed: ESOPHAGOGASTRODUODENOSCOPY (EGD) WITH PROPOFOL (N/A ) POLYPECTOMY  Patient Location: PACU and Endoscopy Unit  Anesthesia Type:MAC  Level of Consciousness: awake, alert  and patient cooperative  Airway & Oxygen Therapy: Patient Spontanous Breathing and Patient connected to nasal cannula oxygen  Post-op Assessment: Report given to RN and Post -op Vital signs reviewed and stable  Post vital signs: Reviewed and stable  Last Vitals:  Vitals Value Taken Time  BP 106/66 08/24/2018  8:24 AM  Temp    Pulse 58 08/24/2018  8:25 AM  Resp 20 08/24/2018  8:25 AM  SpO2 100 % 08/24/2018  8:25 AM  Vitals shown include unvalidated device data.  Last Pain:  Vitals:   08/24/18 0651  TempSrc: Oral  PainSc:          Complications: No apparent anesthesia complications

## 2018-08-24 NOTE — H&P (Signed)
HPI:   Ronald Mccormick is a 72 y.o. male here for EGD to reassess large gastric polyps noted on prior EGD. He had a large hiatal hernia which was recently repaired per Dr. Johney Maine and had a Nissen fundoplication. He reports being able to come off all antacids since that time and doing really well. Last EGD remarkable for multiple fundic gland polyps, some large around 2cm in size or so. Here for EGD to reassess gastric polyps and removed the largest lesions if they persist off PPI.  Past Medical History:  Diagnosis Date  . Chronic cholecystitis   . GERD (gastroesophageal reflux disease)    in past/ off medication  . Gouty arthritis of right great toe    finished prednisone treatment 05-05-2018--  per pt resolved  . Hiatal hernia   . History of colon polyps   . HOH (hard of hearing)     Past Surgical History:  Procedure Laterality Date  . CHOLECYSTECTOMY    . COLONOSCOPY  08/2013  . ESOPHAGEAL MANOMETRY N/A 04/10/2018   Procedure: ESOPHAGEAL MANOMETRY (EM);  Surgeon: Mauri Pole, MD;  Location: [facility];  Service: Endoscopy;  Laterality: N/A;  . LAPAROSCOPIC NISSEN FUNDOPLICATION  28/31/5176  . LEFT HEART CATHETERIZATION WITH CORONARY/GRAFT ANGIOGRAM N/A 05/24/2014   Procedure: LEFT HEART CATHETERIZATION WITH Beatrix Fetters;  Surgeon: Birdie Riddle, MD;  Location: [place] CATH [facility];  Service: Cardiovascular;  Laterality: N/A; minimal luminal irreglarities involving pLAD and LCFx, otherwise normal coronary arteries, normal LVSF (ef 60-65%)and LVEDP (18mmHg)  . ROBOTIC ASSISTED LAPAROSCOPIC CHOLECYSTECTOMY N/A 05/13/2018   Procedure: ROBOTIC ASSISTED LAPAROSCOPIC CHOLECYSTECTOMY;  Surgeon: Michael Boston, MD;  Location: [facility] ORS;  Service: General;  Laterality: N/A;  . STAPEDES SURGERY Right 1986  . TONSILLECTOMY  1954  . UPPER GASTROINTESTINAL ENDOSCOPY  03/2018    Family History  Problem Relation Age of Onset  . Colon cancer Father 33  . Esophageal cancer  Father   . Colon polyps Father   . Heart disease Father   . Hypertension Mother   . Stroke Mother   . Cancer Maternal Grandfather        mets  . Rectal cancer Neg Hx   . Stomach cancer Neg Hx      Social History   Tobacco Use  . Smoking status: Former Smoker    Years: 3.00    Types: Pipe    Start date: 12/30/1978    Last attempt to quit: 05/05/1984    Years since quitting: 34.3  . Smokeless tobacco: Never Used  Substance Use Topics  . Alcohol use: Yes    Alcohol/week: 4.0 standard drinks    Types: 4 Cans of beer per week  . Drug use: No    Prior to Admission medications   Medication Sig Start Date End Date Taking? Authorizing Provider  Multiple Vitamin (MULTIVITAMIN) tablet Take 1 tablet by mouth daily.   Yes [provider]  ondansetron (ZOFRAN) 4 MG tablet Take 1 tablet (4 mg total) by mouth every 8 (eight) hours as needed for nausea. Patient not taking: Reported on 06/01/2018 05/13/18   Michael Boston, MD  promethazine (PHENERGAN) 25 MG suppository Place 1 suppository (25 mg total) rectally every 6 (six) hours as needed for nausea. Patient not taking: Reported on 06/01/2018 05/13/18   Michael Boston, MD    Current Facility-Administered Medications  Medication Dose Route Frequency Provider Last Rate Last Dose  . lactated ringers  infusion   Intravenous Continuous Yetta Flock, MD 10 mL/hr at 08/24/18 0702 1,000 mL at 08/24/18 0702    Allergies as of 06/23/2018  . (No Known Allergies)     Review of Systems:    As per HPI, otherwise negative    Physical Exam:  Vital signs in last 24 hours: Temp:  [97.8 F (36.6 C)] 97.8 F (36.6 C) (08/26 0651) Pulse Rate:  [64] 64 (08/26 0651) Resp:  [10] 10 (08/26 0651) BP: (132)/(88) 132/88 (08/26 0651) SpO2:  [100 %] 100 % (08/26 0651) Weight:  [73.5 kg] 73.5 kg (08/26 0647)   General:   Pleasant male in NAD Lungs:  Respirations even and unlabored. Lungs clear to auscultation bilaterally.   No wheezes, crackles,  or rhonchi.  Heart:  Regular rate and rhythm; no MRG Abdomen:  Soft, nondistended, nontender. Normal bowel sounds. No appreciable masses or hepatomegaly.  Extremities:  Without edema.  Lab Results  Component Value Date   WBC 16.4 (H) 05/05/2018   HGB 16.6 05/05/2018   HCT 49.2 05/05/2018   MCV 89.6 05/05/2018   PLT 274 05/05/2018    Lab Results  Component Value Date   CREATININE 1.37 (H) 05/05/2018   BUN 21 (H) 05/05/2018   NA 140 05/05/2018   K 4.7 05/05/2018   CL 106 05/05/2018   CO2 24 05/05/2018    Lab Results  Component Value Date   ALT 23 03/12/2018   AST 17 03/12/2018   ALKPHOS 70 03/12/2018   BILITOT 0.5 03/12/2018      Impression / Plan:   72 y/o male with a history of GERD s/p Nissen / hiatal hernia repair, now off all PPI, with a history of multiple large gastric polyps, here for surveillance EGD to re-evaluate gastric polyps, remove the largest if they persist. Case being done at the hospital given increased risk for bleeding with removal of large gastric polyps. Discussed risks / benefits of EGD and anesthesia with him, he wanted to proceed. Further recommendations pending the results.   Palmetto Cellar, MD [facility]

## 2018-08-24 NOTE — Interval H&P Note (Signed)
History and Physical Interval Note:  08/24/2018 7:51 AM  Ronald Mccormick  has presented today for surgery, with the diagnosis of large gastric polyp  The various methods of treatment have been discussed with the patient and family. After consideration of risks, benefits and other options for treatment, the patient has consented to  Procedure(s): ESOPHAGOGASTRODUODENOSCOPY (EGD) WITH PROPOFOL (N/A) as a surgical intervention .  The patient's history has been reviewed, patient examined, no change in status, stable for surgery.  I have reviewed the patient's chart and labs.  Questions were answered to the patient's satisfaction.     [place]

## 2018-08-26 ENCOUNTER — Encounter (HOSPITAL_COMMUNITY): Payer: Self-pay | Admitting: Gastroenterology

## 2018-10-11 IMAGING — RF DG ESOPHAGUS
6 series · 24 of 24 positions shown · non-contrast
Comparison: Chest CT 02/21/2018

CLINICAL DATA: Postop day 1 status post Nissen fundoplication.

EXAM:
ESOPHOGRAM/BARIUM SWALLOW
TECHNIQUE: Single contrast examination was performed using  40 cc Hsovue-H33.
FLUOROSCOPY TIME:  Fluoroscopy Time:  1 minutes, 36 seconds
Radiation Exposure Index (if provided by the fluoroscopic device):
21.7 mGy
Number of Acquired Spot Images: 0

[Series 1: cp_standard · 0.36mm/px · 4 of 166 frames shown (1 of 6)]
[frame 25/166]
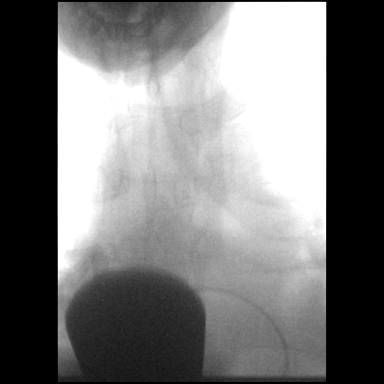
[frame 84/166]
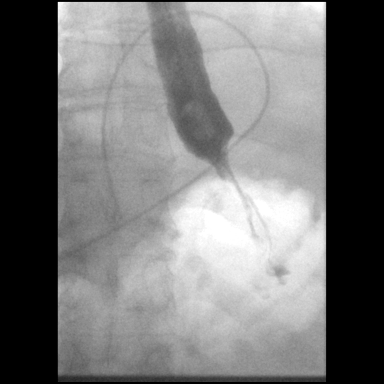
[frame 142/166]
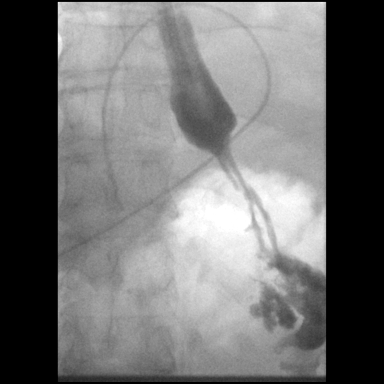
[frame 166/166]
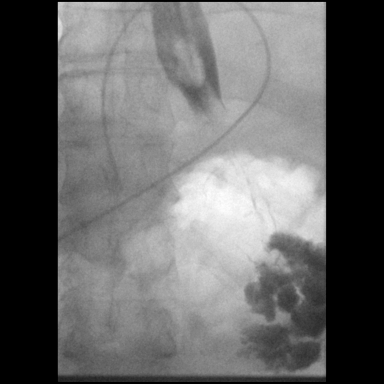

[Series 2: cp_standard · 0.36mm/px · 4 of 134 frames shown (2 of 6)]
[frame 21/134]
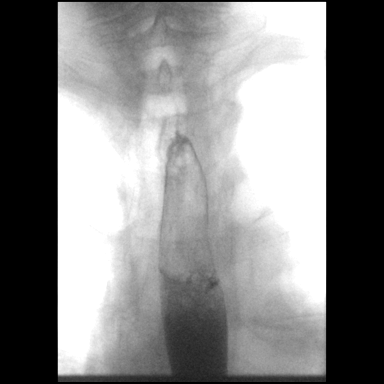
[frame 68/134]
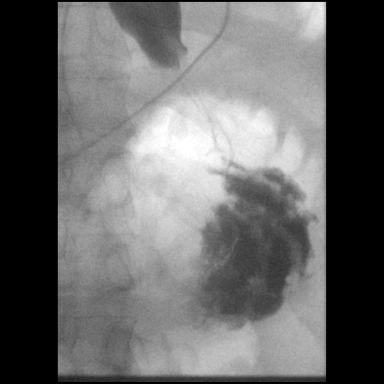
[frame 95/134]
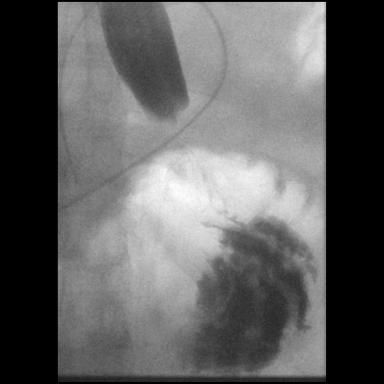
[frame 114/134]
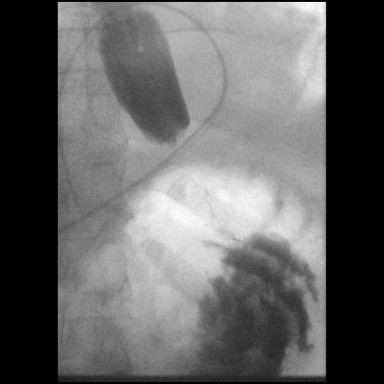

[Series 3: cp_standard · 0.36mm/px · 4 of 25 frames shown (3 of 6)]
[frame 4/25]
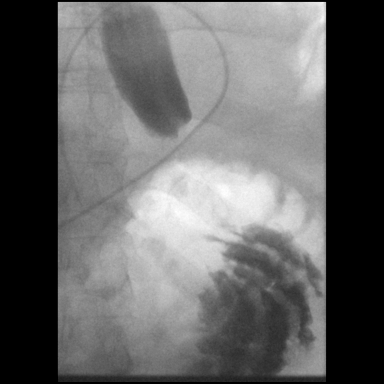
[frame 6/25]
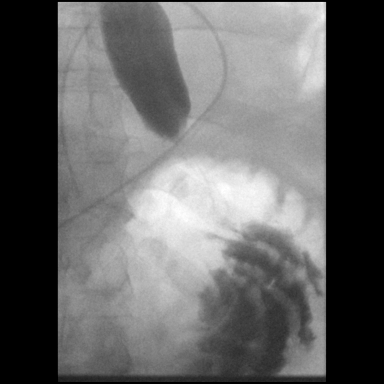
[frame 13/25]
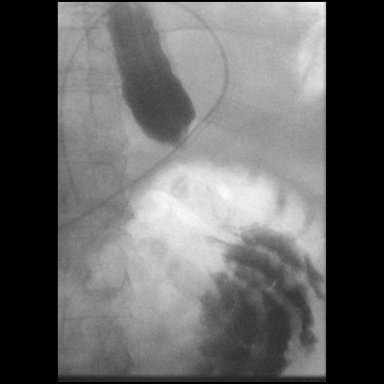
[frame 22/25]
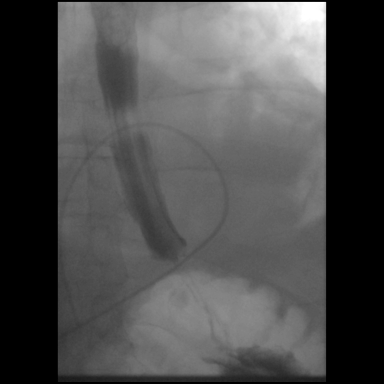

[Series 4: cp_standard · 0.36mm/px · 4 of 85 frames shown (4 of 6)]
[frame 7/85]
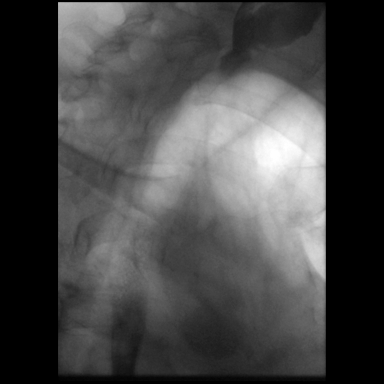
[frame 13/85]
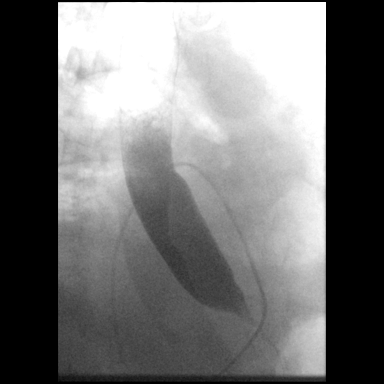
[frame 43/85]
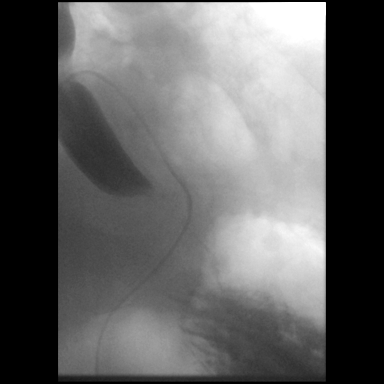
[frame 73/85]
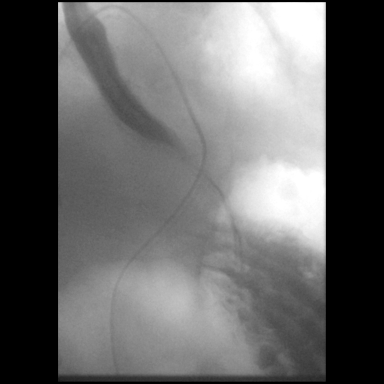

[Series 5: cp_standard · 0.36mm/px · 4 of 36 frames shown (5 of 6)]
[frame 6/36]
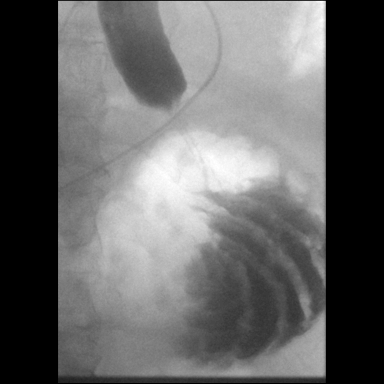
[frame 19/36]
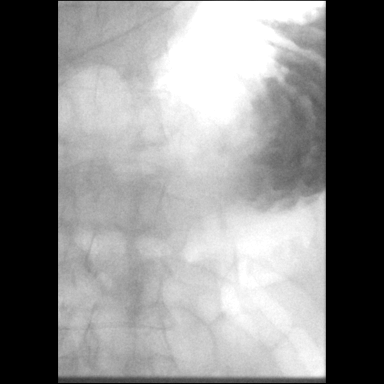
[frame 31/36]
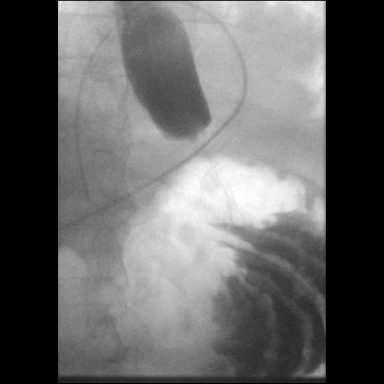
[frame 35/36]
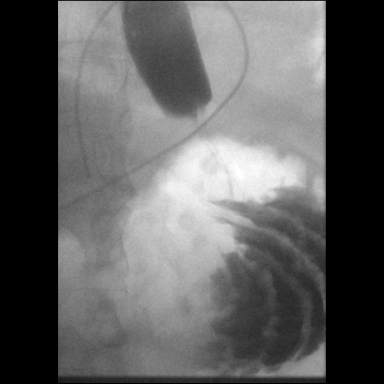

[Series 6: cp_standard · 0.36mm/px · 4 of 11 frames shown (6 of 6)]
[frame 1/11]
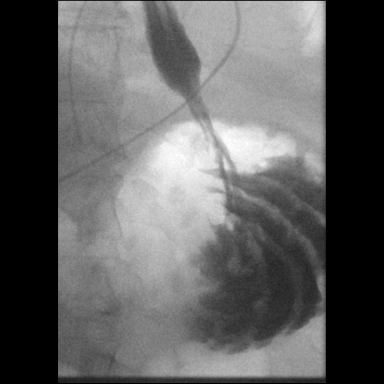
[frame 2/11]
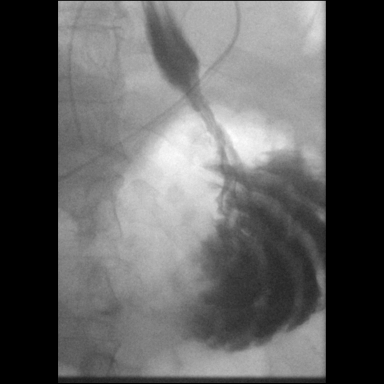
[frame 6/11]
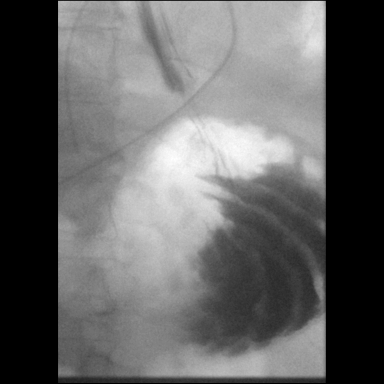
[frame 10/11]
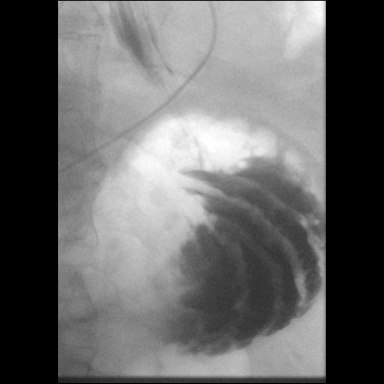

[24 of 24 positions shown; findings below may reference images not displayed]

FINDINGS: No hiatal hernia. Distal esophageal luminal caliber maximum
distention 7 mm, likely due to postoperative swelling and the wrap.
No leak or complicating feature identified. Primary peristaltic
waves in the esophagus were partially ineffective suggesting mild
dysmotility.
IMPRESSION: 1. Distal esophageal luminal caliber in the vicinity of the wrap is
7 mm, likely from postoperative swelling and the wrap itself. No
leak or complicating feature identified.
2. Primary peristaltic waves in the esophagus were somewhat
weak/ineffective suggesting mild dysmotility.

## 2018-10-21 ENCOUNTER — Ambulatory Visit (AMBULATORY_SURGERY_CENTER): Payer: Self-pay | Admitting: *Deleted

## 2018-10-21 VITALS — Ht 69.0 in | Wt 165.0 lb

## 2018-10-21 DIAGNOSIS — Z800 Family history of malignant neoplasm of digestive organs: Secondary | ICD-10-CM

## 2018-10-21 MED ORDER — NA SULFATE-K SULFATE-MG SULF 17.5-3.13-1.6 GM/177ML PO SOLN: ORAL | 0 refills | Status: DC

## 2018-10-21 NOTE — Progress Notes (Signed)
Patient denies any allergies to eggs or soy. Patient denies any problems with anesthesia/sedation. Patient denies any oxygen use at home. Patient denies taking any diet/weight loss medications or blood thinners. EMMI education offered, pt declined video.

## 2018-11-04 ENCOUNTER — Encounter: Payer: Self-pay | Admitting: Gastroenterology

## 2018-11-04 ENCOUNTER — Ambulatory Visit (AMBULATORY_SURGERY_CENTER): Payer: Medicare Other | Admitting: Gastroenterology

## 2018-11-04 VITALS — BP 113/77 | HR 58 | Temp 97.8°F | Resp 13 | Ht 69.0 in | Wt 180.0 lb

## 2018-11-04 DIAGNOSIS — D120 Benign neoplasm of cecum: Secondary | ICD-10-CM

## 2018-11-04 DIAGNOSIS — Z86010 Personal history of colonic polyps: Secondary | ICD-10-CM

## 2018-11-04 DIAGNOSIS — Z800 Family history of malignant neoplasm of digestive organs: Secondary | ICD-10-CM | POA: Diagnosis present

## 2018-11-04 DIAGNOSIS — D123 Benign neoplasm of transverse colon: Secondary | ICD-10-CM

## 2018-11-04 DIAGNOSIS — K635 Polyp of colon: Secondary | ICD-10-CM | POA: Diagnosis not present

## 2018-11-04 MED ORDER — SODIUM CHLORIDE 0.9 % IV SOLN: 500.0000 mL | Freq: Once | INTRAVENOUS | Status: DC

## 2018-11-04 NOTE — Progress Notes (Signed)
A and O x3. Report to RN. Tolerated MAC anesthesia well.

## 2018-11-04 NOTE — Progress Notes (Signed)
Called to room to assist during endoscopic procedure.  Patient ID and intended procedure confirmed with present staff. Received instructions for my participation in the procedure from the performing physician.  

## 2018-11-04 NOTE — Op Note (Signed)
[facility] Patient Name: Ronald Mccormick Procedure Date: 11/04/2018 11:27 AM MRN: [id number] Endoscopist: Remo Lipps P. Havery Moros , MD Age: 72 Referring MD:  Date of Birth: 02/15/1946 Gender: Male Account #: [id number] Procedure:                Colonoscopy Indications:              Screening in patient at increased risk: Family                            history of 1st-degree relative with colorectal                            cancer (father around age 79) Medicines:                Monitored Anesthesia Care Procedure:                Pre-Anesthesia Assessment:                           - Prior to the procedure, a History and Physical                            was performed, and patient medications and                            allergies were reviewed. The patient's tolerance of                            previous anesthesia was also reviewed. The risks                            and benefits of the procedure and the sedation                            options and risks were discussed with the patient.                            All questions were answered, and informed consent                            was obtained. Prior Anticoagulants: The patient has                            taken no previous anticoagulant or antiplatelet                            agents. ASA Grade Assessment: II - A patient with                            mild systemic disease. After reviewing the risks                            and benefits, the patient was deemed in  satisfactory condition to undergo the procedure.                           After obtaining informed consent, the colonoscope                            was passed under direct vision. Throughout the                            procedure, the patient's blood pressure, pulse, and                            oxygen saturations were monitored continuously. The                            Colonoscope was introduced  through the anus and                            advanced to the the cecum, identified by                            appendiceal orifice and ileocecal valve. The                            colonoscopy was performed without difficulty. The                            patient tolerated the procedure well. The quality                            of the bowel preparation was good. The ileocecal                            valve, appendiceal orifice, and rectum were                            photographed. Scope In: 11:35:06 AM Scope Out: 11:55:48 AM Scope Withdrawal Time: 0 hours 16 minutes 7 seconds  Total Procedure Duration: 0 hours 20 minutes 42 seconds  Findings:                 The perianal and digital rectal examinations were                            normal.                           A 3 mm polyp was found in the cecum. The polyp was                            sessile. The polyp was removed with a cold snare.                            Resection and retrieval were complete.  Two sessile polyps were found in the transverse                            colon. The polyps were 3 to 5 mm in size. These                            polyps were removed with a cold snare. Resection                            and retrieval were complete.                           Internal hemorrhoids were found during retroflexion.                           The exam was otherwise without abnormality. Complications:            No immediate complications. Estimated blood loss:                            Minimal. Estimated Blood Loss:     Estimated blood loss was minimal. Impression:               - One 3 mm polyp in the cecum, removed with a cold                            snare. Resected and retrieved.                           - Two 3 to 5 mm polyps in the transverse colon,                            removed with a cold snare. Resected and retrieved.                           - Internal  hemorrhoids.                           - The examination was otherwise normal. Recommendation:           - Patient has a contact number available for                            emergencies. The signs and symptoms of potential                            delayed complications were discussed with the                            patient. Return to normal activities tomorrow.                            Written discharge instructions were provided to the  patient.                           - Resume previous diet.                           - Continue present medications.                           - Await pathology results. Remo Lipps P. , MD 11/04/2018 12:00:07 PM This report has been signed electronically.

## 2018-11-04 NOTE — Progress Notes (Signed)
Pt's states no medical or surgical changes since previsit or office visit. 

## 2018-11-04 NOTE — Patient Instructions (Signed)
YOU HAD AN ENDOSCOPIC PROCEDURE TODAY AT THE [facility]:   Refer to the procedure report that was given to you for any specific questions about what was found during the examination.  If the procedure report does not answer your questions, please call your gastroenterologist to clarify.  If you requested that your care partner not be given the details of your procedure findings, then the procedure report has been included in a sealed envelope for you to review at your convenience later.  YOU SHOULD EXPECT: Some feelings of bloating in the abdomen. Passage of more gas than usual.  Walking can help get rid of the air that was put into your GI tract during the procedure and reduce the bloating. If you had a lower endoscopy (such as a colonoscopy or flexible sigmoidoscopy) you may notice spotting of blood in your stool or on the toilet paper. If you underwent a bowel prep for your procedure, you may not have a normal bowel movement for a few days.  Please Note:  You might notice some irritation and congestion in your nose or some drainage.  This is from the oxygen used during your procedure.  There is no need for concern and it should clear up in a day or so.  SYMPTOMS TO REPORT IMMEDIATELY:   Following lower endoscopy (colonoscopy or flexible sigmoidoscopy):  Excessive amounts of blood in the stool  Significant tenderness or worsening of abdominal pains  Swelling of the abdomen that is new, acute  Fever of 100F or higher  For urgent or emergent issues, a gastroenterologist can be reached at any hour by calling [phone].   DIET:  We do recommend a small meal at first, but then you may proceed to your regular diet.  Drink plenty of fluids but you should avoid alcoholic beverages for 24 hours.  ACTIVITY:  You should plan to take it easy for the rest of today and you should NOT DRIVE or use heavy machinery until tomorrow (because of the sedation medicines used during the test).     FOLLOW UP: Our staff will call the number listed on your records the next business day following your procedure to check on you and address any questions or concerns that you may have regarding the information given to you following your procedure. If we do not reach you, we will leave a message.  However, if you are feeling well and you are not experiencing any problems, there is no need to return our call.  We will assume that you have returned to your regular daily activities without incident.  If any biopsies were taken you will be contacted by phone or by letter within the next 1-3 weeks.  Please call us at [phone] if you have not heard about the biopsies in 3 weeks.    Await for biopsy results Polyps (handout given) Hemorrhoids (handout given)  SIGNATURES/CONFIDENTIALITY: You and/or your care partner have signed paperwork which will be entered into your electronic medical record.  These signatures attest to the fact that that the information above on your After Visit Summary has been reviewed and is understood.  Full responsibility of the confidentiality of this discharge information lies with you and/or your care-partner. 

## 2018-11-05 ENCOUNTER — Telehealth: Payer: Self-pay

## 2018-11-05 NOTE — Telephone Encounter (Signed)
  Follow up Call-  Call back number 11/04/2018 04/02/2018  Post procedure Call Back phone  # [phone]  Permission to leave phone message Yes Yes  Some recent data might be hidden     Patient questions:  Do you have a fever, pain , or abdominal swelling? No. Pain Score  0 *  Have you tolerated food without any problems? Yes.    Have you been able to return to your normal activities? Yes.    Do you have any questions about your discharge instructions: Diet   No. Medications  No. Follow up visit  No.  Do you have questions or concerns about your Care? No.  Actions: * If pain score is 4 or above: No action needed, pain <4.

## 2019-12-31 DIAGNOSIS — S92009A Unspecified fracture of unspecified calcaneus, initial encounter for closed fracture: Secondary | ICD-10-CM

## 2019-12-31 HISTORY — DX: Unspecified fracture of unspecified calcaneus, initial encounter for closed fracture: S92.009A

## 2020-12-08 ENCOUNTER — Other Ambulatory Visit: Payer: Self-pay | Admitting: Orthopaedic Surgery

## 2020-12-08 ENCOUNTER — Other Ambulatory Visit: Payer: Self-pay

## 2020-12-08 ENCOUNTER — Ambulatory Visit
Admission: RE | Admit: 2020-12-08 | Discharge: 2020-12-08 | Disposition: A | Discharge status: Home / Self Care | Payer: Medicare Other | Source: Ambulatory Visit | Attending: Orthopaedic Surgery | Admitting: Orthopaedic Surgery

## 2020-12-08 DIAGNOSIS — S92002A Unspecified fracture of left calcaneus, initial encounter for closed fracture: Secondary | ICD-10-CM

## 2021-05-08 ENCOUNTER — Other Ambulatory Visit: Payer: Self-pay | Admitting: Otolaryngology

## 2021-05-08 DIAGNOSIS — H7102 Cholesteatoma of attic, left ear: Secondary | ICD-10-CM

## 2021-05-12 ENCOUNTER — Other Ambulatory Visit: Payer: Self-pay

## 2021-05-12 ENCOUNTER — Ambulatory Visit
Admission: RE | Admit: 2021-05-12 | Discharge: 2021-05-12 | Disposition: A | Discharge status: Home / Self Care | Payer: Medicare Other | Source: Ambulatory Visit | Attending: Otolaryngology | Admitting: Otolaryngology

## 2021-05-12 DIAGNOSIS — H7102 Cholesteatoma of attic, left ear: Secondary | ICD-10-CM

## 2022-01-28 ENCOUNTER — Encounter: Payer: Self-pay | Admitting: Gastroenterology

## 2022-02-11 ENCOUNTER — Telehealth: Payer: Self-pay | Admitting: Gastroenterology

## 2022-02-11 NOTE — Telephone Encounter (Signed)
Inbound call from patient requesting to schedule procedures but per recall EGD should be scheduled at hospital.  Please advise.

## 2022-02-12 NOTE — Telephone Encounter (Signed)
Called and spoke with patient regarding information. He has been scheduled for a f/u appt with Dr. Havery Moros on Monday, 03/25/22 at 9:50 am. Pt verbalized understanding and had no concerns at the end of the call.

## 2022-02-12 NOTE — Telephone Encounter (Signed)
I think best to see him in clinic. We may be able to extend his surveillance a bit longer on the colon given new updated guidelines and can discuss if he wants another EGD or not. Can you help book a clinic visit? thanks

## 2022-03-25 ENCOUNTER — Ambulatory Visit (INDEPENDENT_AMBULATORY_CARE_PROVIDER_SITE_OTHER): Payer: Medicare Other | Admitting: Gastroenterology

## 2022-03-25 ENCOUNTER — Encounter: Payer: Self-pay | Admitting: Gastroenterology

## 2022-03-25 VITALS — BP 130/76 | HR 72 | Ht 71.0 in | Wt 169.4 lb

## 2022-03-25 DIAGNOSIS — K317 Polyp of stomach and duodenum: Secondary | ICD-10-CM

## 2022-03-25 DIAGNOSIS — Z86010 Personal history of colonic polyps: Secondary | ICD-10-CM

## 2022-03-25 DIAGNOSIS — R194 Change in bowel habit: Secondary | ICD-10-CM | POA: Diagnosis not present

## 2022-03-25 DIAGNOSIS — K31A0 Gastric intestinal metaplasia, unspecified: Secondary | ICD-10-CM | POA: Diagnosis not present

## 2022-03-25 MED ORDER — SUTAB 1479-225-188 MG PO TABS: 1.0000 | ORAL_TABLET | Freq: Once | ORAL | 0 refills | Status: AC

## 2022-03-25 NOTE — Progress Notes (Signed)
? ?HPI :  ?76 year old male here with a history of GERD, colon polyps, family history of colon cancer, gastric polyps, here for a follow-up visit.  I last saw him in 2019. ? ?Recall he had a history of being on Dexilant longstanding for reflux.  He had a large hiatal hernia on prior endoscopy and multiple large fundic gland polyps.  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  Since we did that exam he has since had hiatal hernia repaired by general surgery.  He is off all PPI for a few years now and states he has been doing really well in this regard, happy with how he been feeling with reflux.  He denies any dysphagia.  He does have some decreased appetite at times and not eating as well as he used to do, but no nausea or vomiting.  Recall his father had a history of colon cancer around age 64 and he thinks also esophageal cancer but he is not sure.  We had discussed doing a surveillance EGD after his last exam but have not seen him for some time. ? ?Otherwise he was having biliary colic the last time I saw him.  We had referred him to see the surgeon for gallstones, he had cholecystectomy done at the same time as his hiatal hernia repair.  Since that time he has been doing pretty well without any recurrent biliary colic but he does have occasional loose stools anywhere 2-3 times per day.  He states at every stools loose, does have occasional urgency but not routinely.  He states he lives with it and does not really bother him too much.  He was tried on cholestyramine in the past for short trial and states it helped a little bit but he did not want to take it long-term.  His last colonoscopy was 10/2018, he had 3 small polyps removed, adenomas and sessile serrated, we had discussed doing a surveillance colonoscopy 3 years later. ? ?  ?EGD 09/22/2013 - irregular z-line, multiple inflamed gastric polyps, hiatal hernia - fundic gland  polyps, no evidence of Barrett's ?Colonoscopy 09/27/2013 - hyperplastic rectal polyp ?  ?  ?EGD 04/02/2018 -  ?- A 5 cm hiatal hernia was present. ?- LA Grade B esophagitis was found at the GEJ. ?- The Z-line was irregular with a 1cm tongue of salmon colored mucosa without nodularity ?and was found 34 cm from the incisors. Biopsies were taken with a cold forceps for histology. ?- The exam of the esophagus was otherwise normal. ?- Multiple large sessile polyps were found in the gastric fundus and in the gastric body (largest ?48m-20mm in size). Biopsies were taken with a cold forceps for histology. ?- The exam of the stomach was otherwise normal. 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 Biopsies were taken with a cold forceps for histology. ?- The exam of the duodenum was otherwise normal. ? ?1. Surgical [P], duodenal polyp ?- GASTRIC HETEROTOPIA. ?- NO DYSPLASIA OR MALIGNANCY. ?2. Surgical [P], GE junction ?- REFLUX CHANGES. ?- NO INTESTINAL METAPLASIA, DYSPLASIA, OR MALIGNANCY. ?3. Surgical [P], gastric polyps ?- FUNDIC GLAND POLYP. ?- NEGATIVE FOR HELICOBACTER PYLORI. ?- NO INTESTINAL METAPLASIA, DYSPLASIA, OR MALIGNANCY. ? ? ? ?EGD 08/24/2018: ?Z-line, 35 cm from the incisors. ?- Very mild esophagitis at the GEJ, improved since previous exam post surgery. ?- A Toupet fundoplication was found. The wrap appears  intact. ?- Multiple gastric polyps. The largest were resected and retrieved as above. One ?prophylactic clip was placed to prevent bleeding. 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 ? ?Stomach, polyp(s) ?- HYPERPLASTIC POLYPS WITH FOCAL INTESTINAL METAPLASIA. ?- NO DYSPLASIA OR MALIGNANCY. ? ? ?Colonoscopy 11/04/2018:The perianal and digital rectal examinations were normal. ?- A 3 mm polyp was found in the cecum. The polyp was sessile. The polyp was removed ?with a cold snare. Resection and retrieval  were complete. ?- Two sessile polyps were found in the transverse colon. The polyps were 3 to 5 mm in size. ?These polyps were removed with a cold snare. Resection and retrieval were complete. ?- Internal hemorrhoids were found during retroflexion. ?- The exam was otherwise without abnormality. ? ?Surgical [P], cecum and transverse, polyp (3) ?- TUBULAR ADENOMA(S). ?- SESSILE SERRATED POLYP WITHOUT CYTOLOGIC DYSPLASIA. ?- HIGH GRADE DYSPLASIA IS NOT IDENTIFIED. ? ? ? ?Past Medical History:  ?Diagnosis Date  ? Chronic cholecystitis   ? GERD (gastroesophageal reflux disease)   ? in past/ off medication  ? Gouty arthritis of right great toe   ? finished prednisone treatment 05-05-2018--  per pt resolved  ? Heel fracture 2021  ? Left  ? Hiatal hernia   ? History of colon polyps   ? HOH (hard of hearing)   ? ? ? ?Past Surgical History:  ?Procedure Laterality Date  ? CHOLECYSTECTOMY    ? COLONOSCOPY  09/27/2013  ? ESOPHAGEAL MANOMETRY N/A 04/10/2018  ? Procedure: ESOPHAGEAL MANOMETRY (EM);  Surgeon: Mauri Pole, MD;  Location: [facility];  Service: Endoscopy;  Laterality: N/A;  ? ESOPHAGOGASTRODUODENOSCOPY (EGD) WITH PROPOFOL N/A 08/24/2018  ? Procedure: ESOPHAGOGASTRODUODENOSCOPY (EGD) WITH PROPOFOL;  Surgeon: Yetta Flock, MD;  Location: [facility];  Service: Gastroenterology;  Laterality: N/A;  ? LAPAROSCOPIC NISSEN FUNDOPLICATION  20/09/711  ? LEFT HEART CATHETERIZATION WITH CORONARY/GRAFT ANGIOGRAM N/A 05/24/2014  ? Procedure: LEFT HEART CATHETERIZATION WITH Beatrix Fetters;  Surgeon: Birdie Riddle, MD;  Location: [facility];  Service: Cardiovascular;  Laterality: N/A; minimal luminal irreglarities involving pLAD and LCFx, otherwise normal coronary arteries, normal LVSF (ef 60-65%)and LVEDP (12mHg)  ? POLYPECTOMY  08/24/2018  ? Procedure: POLYPECTOMY & CLIP PLACEMENT;  Surgeon: AYetta Flock MD;  [facility];  Service: Gastroenterology;;  ? ROBOTIC ASSISTED LAPAROSCOPIC  CHOLECYSTECTOMY N/A 05/13/2018  ? Procedure: ROBOTIC ASSISTED LAPAROSCOPIC CHOLECYSTECTOMY;  Surgeon: GMichael Boston MD;  Location: [facility];  Service: General;  Laterality: N/A;  ? [place] ? TONSILLECTOMY  1954  ? UPPER GASTROINTESTINAL ENDOSCOPY  03/2018  ? ?Family History  ?Problem Relation Age of Onset  ? Colon cancer Father 614 ? Esophageal cancer Father   ? Colon polyps Father   ? Heart disease Father   ? Hypertension Mother   ? Stroke Mother   ? Cancer Maternal Grandfather   ?     mets  ? Rectal cancer Neg Hx   ? Stomach cancer Neg Hx   ? ?Social History  ? ?Tobacco Use  ? Smoking status: Former  ?  Types: Pipe  ?  Start date: 12/30/1978  ?  Quit date: 05/05/1984  ?  Years since quitting: 37.9  ? Smokeless tobacco: Never  ?Vaping Use  ? Vaping Use: Never used  ?Substance Use Topics  ? Alcohol use: Yes  ?  Alcohol/week: 4.0 - 5.0 standard drinks  ?  Types: 4 - 5 Cans of beer per week  ?  Drug use: No  ? ?Current Outpatient Medications  ?Medication Sig Dispense Refill  ? ciprofloxacin-dexamethasone (CIPRODEX) OTIC suspension 4 drops as needed. In left ear    ? Multiple Vitamin (MULTIVITAMIN) tablet Take 1 tablet by mouth daily.    ? ?No current facility-administered medications for this visit.  ? ?No Known Allergies ? ? ?Review of Systems: ?All systems reviewed and negative except where noted in HPI.  ? ?Lab Results  ?Component Value Date  ? WBC 16.4 (H) 05/05/2018  ? HGB 16.6 05/05/2018  ? HCT 49.2 05/05/2018  ? MCV 89.6 05/05/2018  ? PLT 274 05/05/2018  ? ? ?Lab Results  ?Component Value Date  ? CREATININE 1.37 (H) 05/05/2018  ? BUN 21 (H) 05/05/2018  ? NA 140 05/05/2018  ? K 4.7 05/05/2018  ? CL 106 05/05/2018  ? CO2 24 05/05/2018  ? ? ?Lab Results  ?Component Value Date  ? ALT 23 03/12/2018  ? AST 17 03/12/2018  ? ALKPHOS 70 03/12/2018  ? BILITOT 0.5 03/12/2018  ? ? ? ?Physical Exam: ?BP 130/76   Pulse 72   Ht '5\' 11"'$  (1.803 m)   Wt 169 lb 6 oz (76.8 kg)   BMI 23.62 kg/m?  ?Constitutional:  Pleasant,well-developed, male in no acute distress. ?HEENT: Normocephalic and atraumatic. Conjunctivae are normal. No scleral icterus. ?Neck supple.  ?Cardiovascular: Normal rate, regular rhythm.  ?Pulmonary/c

## 2022-03-25 NOTE — Patient Instructions (Addendum)
If you are age 74 or older, your body mass index should be between 23-30. Your Body mass index is 23.62 kg/m?Marland Kitchen If this is out of the aforementioned range listed, please consider follow up with your Primary Care Provider. ? ?If you are age 83 or younger, your body mass index should be between 19-25. Your Body mass index is 23.62 kg/m?Marland Kitchen If this is out of the aformentioned range listed, please consider follow up with your Primary Care Provider.  ? ?________________________________________________________ ? ?The [facility] providers would like to encourage you to use [facility] to communicate with providers for non-urgent requests or questions.  Due to long hold times on the telephone, sending your provider a message by [facility] may be a faster and more efficient way to get a response.  Please allow 48 business hours for a response.  Please remember that this is for non-urgent requests.  ?_______________________________________________________ ? ?You have been scheduled for an endoscopy and colonoscopy. Please follow the written instructions given to you at your visit today. ?Please pick up your prep supplies at the pharmacy within the next 1-3 days. ?If you use inhalers (even only as needed), please bring them with you on the day of your procedure. ? ?Thank you for entrusting me with your care and for choosing [employer], ?Dr. Versailles Cellar ? ? ? ? ? ?

## 2022-05-16 ENCOUNTER — Ambulatory Visit (AMBULATORY_SURGERY_CENTER): Payer: Medicare Other | Admitting: Gastroenterology

## 2022-05-16 ENCOUNTER — Encounter: Payer: Self-pay | Admitting: Gastroenterology

## 2022-05-16 VITALS — BP 96/60 | HR 53 | Temp 97.7°F | Resp 16 | Ht 71.0 in | Wt 169.0 lb

## 2022-05-16 DIAGNOSIS — D120 Benign neoplasm of cecum: Secondary | ICD-10-CM

## 2022-05-16 DIAGNOSIS — Z86010 Personal history of colonic polyps: Secondary | ICD-10-CM

## 2022-05-16 DIAGNOSIS — D128 Benign neoplasm of rectum: Secondary | ICD-10-CM

## 2022-05-16 DIAGNOSIS — K317 Polyp of stomach and duodenum: Secondary | ICD-10-CM

## 2022-05-16 DIAGNOSIS — K2270 Barrett's esophagus without dysplasia: Secondary | ICD-10-CM | POA: Diagnosis not present

## 2022-05-16 DIAGNOSIS — K319 Disease of stomach and duodenum, unspecified: Secondary | ICD-10-CM | POA: Diagnosis not present

## 2022-05-16 DIAGNOSIS — K635 Polyp of colon: Secondary | ICD-10-CM

## 2022-05-16 DIAGNOSIS — K621 Rectal polyp: Secondary | ICD-10-CM

## 2022-05-16 DIAGNOSIS — K449 Diaphragmatic hernia without obstruction or gangrene: Secondary | ICD-10-CM | POA: Diagnosis not present

## 2022-05-16 MED ORDER — OMEPRAZOLE 20 MG PO CPDR: 20.0000 mg | DELAYED_RELEASE_CAPSULE | Freq: Every day | ORAL | 3 refills | Status: DC

## 2022-05-16 MED ORDER — SODIUM CHLORIDE 0.9 % IV SOLN: 500.0000 mL | Freq: Once | INTRAVENOUS | Status: DC

## 2022-05-16 NOTE — Progress Notes (Signed)
To pacu, VSS. Report to [email or web address] 

## 2022-05-16 NOTE — Progress Notes (Signed)
Called to room to assist during endoscopic procedure.  Patient ID and intended procedure confirmed with present staff. Received instructions for my participation in the procedure from the performing physician.  

## 2022-05-16 NOTE — Op Note (Signed)
[name] Procedure Date: 05/16/2022 1:30 PM MRN: [id number] Endoscopist: Remo Lipps P. 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 The patient's tolerance of                            previous anesthesia was also reviewed. The risks                            and benefits of the procedure and the sedation                            options and risks were discussed with the patient.                            All questions were answered, and informed consent                            was obtained. Prior Anticoagulants: The patient has                            taken no previous anticoagulant or antiplatelet  agents. ASA Grade Assessment: II - A patient with                            mild systemic disease. After reviewing the risks                            and benefits, the patient was deemed in                            satisfactory condition to undergo the procedure.                           After obtaining informed consent, the endoscope was                            passed under  direct vision. Throughout the                            procedure, the patient's blood pressure, pulse, and                            oxygen saturations were monitored continuously. The                            Endoscope was introduced through the mouth, and                            advanced to the duodenal bulb. The upper GI                            endoscopy was accomplished without difficulty. The                            patient tolerated the procedure well. Scope In: Scope Out: Findings:                 Esophagogastric landmarks were identified: the                            Z-line was found at 35 cm, the gastroesophageal                            junction was found at 37 cm and the upper extent of                            the gastric folds was found at 40 cm from the                            incisors.                           A 3 cm hiatal hernia was present.  There were esophageal mucosal changes classified as                            suspected Barrett's stage C0-M2 per Prague criteria                            present in the lower third of the esophagus. The                            maximum longitudinal extent of these mucosal                            changes was 2 cm in length. Mucosa was biopsied                            with a cold forceps for histology.                           The exam of the esophagus was otherwise normal.                           Evidence of a Toupet fundoplication was found in                            the cardia. The wrap appeared loose. The proximal                            stomach could not retain air well at all,                            retroflexed views of the cardia / fundus were                            limited due to lack of air retention.                           Patchy mildly erythematous mucosa was found in the                            gastric body. Biopsies were taken with a cold                             forceps for Helicobacter pylori testing.                           The exam of the stomach was otherwise normal.                            Interval resolution of gastric polyps off PPI                           Nodular mucosa was found in the duodenal bulb  consistent with benign ectopic gastric mucosa.                           The exam of the duodenum was otherwise normal. Complications:            No immediate complications. Estimated blood loss:                            Minimal. Estimated Blood Loss:     Estimated blood loss was minimal. Impression:               - Esophagogastric landmarks identified.                           - 3 cm hiatal hernia.                           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 Biopsied.                           - Normal esophagus otherwise                           - A Toupet fundoplication was found. The wrap                            appears loose and the patient could not retain air                            well in the proximal stomach.                           - Erythematous mucosa in the gastric body. Biopsied.                           - Normal stomach otherwise                           - Nodular mucosa in the duodenal bulb consistent                            with benign ectopic gastric mucosa. Recommendation:           - Patient has a contact number available for                            emergencies. The signs and symptoms of potential                            delayed complications were discussed with the                            patient. Return to normal activities tomorrow.  Written discharge instructions were provided to the                            patient.                           - Resume previous diet.                           - Continue present medications.                            - Await pathology results.                           - It appears the patient has likely developed a                            short segment of Barrett's while off PPI, in                            setting of loosening of fundoplication. While                            gastric polyps have resolved, I do think he would                            benefit from resumption of PPI. I will discuss with                            him Carlota Raspberry. , MD 05/16/2022 2:21:18 PM This report has been signed electronically.

## 2022-05-16 NOTE — Progress Notes (Signed)
[facility] History and Physical   Primary Care Physician:  Christain Sacramento, MD   Reason for Procedure:   History of gastric polyps, history of colon polyps  Plan:    EGD / colonoscopy     HPI: Ronald Mccormick is a 76 y.o. 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 Sp hiatal hernia repair / Nissen, off PPI. History of adenomas removed in 2019. Father with CRC age 8.  Otherwise feels well without any cardiopulmonary symptoms.    Past Medical History:  Diagnosis Date   Chronic cholecystitis    GERD (gastroesophageal reflux disease)    in past/ off medication   Gouty arthritis of right great toe    finished prednisone treatment 05-05-2018--  per pt resolved   Heel fracture 2021   Left   Hiatal hernia    History of colon polyps    HOH (hard of hearing)     Past Surgical History:  Procedure Laterality Date   CHOLECYSTECTOMY     COLONOSCOPY  09/27/2013   ESOPHAGEAL MANOMETRY N/A 04/10/2018   Procedure: ESOPHAGEAL MANOMETRY (EM);  Surgeon: Mauri Pole, MD;  Location: [facility];  Service: Endoscopy;  Laterality: N/A;   ESOPHAGOGASTRODUODENOSCOPY (EGD) WITH PROPOFOL N/A 08/24/2018   Procedure: ESOPHAGOGASTRODUODENOSCOPY (EGD) WITH PROPOFOL;  Surgeon: Yetta Flock, MD;  Location: [facility];  Service: Gastroenterology;  Laterality: N/A;   LAPAROSCOPIC NISSEN FUNDOPLICATION  58/85/0277   LEFT HEART CATHETERIZATION WITH CORONARY/GRAFT ANGIOGRAM N/A 05/24/2014   Procedure: LEFT HEART CATHETERIZATION WITH Beatrix Fetters;  Surgeon: Birdie Riddle, MD;  Location: [facility];  Service: Cardiovascular;  Laterality: N/A; minimal luminal irreglarities involving pLAD and LCFx, otherwise normal coronary arteries, normal LVSF (ef 60-65%)and LVEDP (28mHg)   POLYPECTOMY  08/24/2018   Procedure: POLYPECTOMY & CLIP PLACEMENT;  Surgeon: AYetta Flock MD;   Location: [facility];  Service: Gastroenterology;;   ROBOTIC ASSISTED LAPAROSCOPIC CHOLECYSTECTOMY N/A 05/13/2018   Procedure: ROBOTIC ASSISTED LAPAROSCOPIC CHOLECYSTECTOMY;  Surgeon: GMichael Boston MD;  Location: [facility];  Service: General;  Laterality: N/A;   [place] 03/2018    Prior to Admission medications   Medication Sig Start Date End Date Taking? Authorizing Provider  ciprofloxacin-dexamethasone (CIPRODEX) OTIC suspension 4 drops as needed. In left ear 03/11/22  Yes [provider]  Multiple Vitamin (MULTIVITAMIN) tablet Take 1 tablet by mouth daily.   Yes [provider]    Current Outpatient Medications  Medication Sig Dispense Refill   ciprofloxacin-dexamethasone (CIPRODEX) OTIC suspension 4 drops as needed. In left ear     Multiple Vitamin (MULTIVITAMIN) tablet Take 1 tablet by mouth daily.     Current Facility-Administered Medications  Medication Dose Route Frequency Provider Last Rate Last Admin   0.9 %  sodium chloride infusion  500 mL Intravenous Once , SCarlota Raspberry MD        Allergies as of 05/16/2022   (No Known Allergies)    Family History  Problem Relation Age of Onset   Colon cancer Father 645  Esophageal cancer Father    Colon polyps Father    Heart disease Father    Hypertension Mother    Stroke Mother    Cancer Maternal Grandfather        mets   Rectal cancer Neg Hx    Stomach cancer Neg Hx  Social History   Socioeconomic History   Marital status: Married    Spouse name: Not on file   Number of children: 3   Years of education: Not on file   Highest education level: Not on file  Occupational History   Occupation: retired    Fish farm manager: [employer]  Tobacco Use   Smoking status: Former    Types: Pipe    Start date: 12/30/1978    Quit date: 05/05/1984    Years since quitting: 38.0   Smokeless tobacco: Never  Vaping Use   Vaping Use: Never used   Substance and Sexual Activity   Alcohol use: Yes    Alcohol/week: 4.0 - 5.0 standard drinks    Types: 4 - 5 Cans of beer per week   Drug use: No   Sexual activity: Not on file  Other Topics Concern   Not on file  Social History Narrative   Not on file   Social Determinants of Health   Financial Resource Strain: Not on file  Food Insecurity: Not on file  Transportation Needs: Not on file  Physical Activity: Not on file  Stress: Not on file  Social Connections: Not on file  Intimate Partner Violence: Not on file    Review of Systems: All other review of systems negative except as mentioned in the HPI.  Physical Exam: Vital signs BP 127/84   Pulse 72   Temp 97.7 F (36.5 C) (Temporal)   Ht '5\' 11"'$  (1.803 m)   Wt 169 lb (76.7 kg)   SpO2 97%   BMI 23.57 kg/m   General:   Alert,  Well-developed, pleasant and cooperative in NAD Lungs:  Clear throughout to auscultation.   Heart:  Regular rate and rhythm Abdomen:  Soft, nontender and nondistended.   Neuro/Psych:  Alert and cooperative. Normal mood and affect. A and O x 3  Jolly Mango, MD [facility]

## 2022-05-16 NOTE — Op Note (Signed)
[facility] Patient Name: Ronald Mccormick Procedure Date: 05/16/2022 1:29 PM MRN: [id number] Endoscopist: Remo Lipps P. Havery Moros , MD Age: 76 Referring MD:  Date of Birth: 02-25-1946 Gender: Male Account #: [id number] Procedure:                Colonoscopy Indications:              High risk colon cancer surveillance: Personal                            history of colonic polyps - 3 polyps removed                            10/2018, father with CRC age 55 Medicines:                Monitored Anesthesia Care Procedure:                Pre-Anesthesia Assessment:                           - Prior to the procedure, a History and Physical                            was performed, and patient medications and                            allergies were reviewed. The patient's tolerance of                            previous anesthesia was also reviewed. The risks                            and benefits of the procedure and the sedation                            options and risks were discussed with the patient.                            All questions were answered, and informed consent                            was obtained. Prior Anticoagulants: The patient has                            taken no previous anticoagulant or antiplatelet                            agents. ASA Grade Assessment: II - A patient with                            mild systemic disease. After reviewing the risks                            and benefits, the patient was deemed in  satisfactory condition to undergo the procedure.                           After obtaining informed consent, the colonoscope                            was passed under direct vision. Throughout the                            procedure, the patient's blood pressure, pulse, and                            oxygen saturations were monitored continuously. The                            Olympus CF-HQ190L [id number])  Colonoscope was                            introduced through the anus and advanced to the the                            cecum, identified by appendiceal orifice and                            ileocecal valve. The colonoscopy was performed                            without difficulty. The patient tolerated the                            procedure well. The quality of the bowel                            preparation was good. The ileocecal valve,                            appendiceal orifice, and rectum were photographed. Scope In: 1:48:45 PM Scope Out: 2:06:50 PM Scope Withdrawal Time: 0 hours 14 minutes 44 seconds  Total Procedure Duration: 0 hours 18 minutes 5 seconds  Findings:                 The perianal and digital rectal examinations were                            normal.                           A 2 mm polyp was found in the cecum. The polyp was                            flat. The polyp was removed with a cold snare.                            Resection and retrieval were complete.  A 3 mm polyp was found in the rectum. The polyp was                            sessile. The polyp was removed with a cold snare.                            Resection and retrieval were complete.                           A few small-mouthed diverticula were found in the                            ascending colon.                           Internal hemorrhoids were found during                            retroflexion. The hemorrhoids were small.                           The exam was otherwise without abnormality. Complications:            No immediate complications. Estimated blood loss:                            Minimal. Estimated Blood Loss:     Estimated blood loss was minimal. Impression:               - One 2 mm polyp in the cecum, removed with a cold                            snare. Resected and retrieved.                           - One 3 mm polyp in the  rectum, removed with a cold                            snare. Resected and retrieved.                           - Diverticulosis in the ascending colon.                           - Internal hemorrhoids.                           - The examination was otherwise normal. Recommendation:           - Patient has a contact number available for                            emergencies. The signs and symptoms of potential                            delayed complications were  discussed with the                            patient. Return to normal activities tomorrow.                            Written discharge instructions were provided to the                            patient.                           - Resume previous diet.                           - Continue present medications.                           - Await pathology results. Remo Lipps P. , MD 05/16/2022 2:12:26 PM This report has been signed electronically.

## 2022-05-16 NOTE — Progress Notes (Signed)
Pt's states no medical or surgical changes since previsit or office visit. 

## 2022-05-16 NOTE — Patient Instructions (Addendum)
Handouts on hemorrhoids, polyps, diverticulosis, hiatal hernia, and Barrett's Esophagus given to patient. Await pathology results. Resume previous diet and continue present medications - Pick up Omeprazole from [facility]:   Refer to the procedure report that was given to you for any specific questions about what was found during the examination.  If the procedure report does not answer your questions, please call your gastroenterologist to clarify.  If you requested that your care partner not be given the details of your procedure findings, then the procedure report has been included in a sealed envelope for you to review at your convenience later.  YOU SHOULD EXPECT: Some feelings of bloating in the abdomen. Passage of more gas than usual.  Walking can help get rid of the air that was put into your GI tract during the procedure and reduce the bloating. If you had a lower endoscopy (such as a colonoscopy or flexible sigmoidoscopy) you may notice spotting of blood in your stool or on the toilet paper. If you underwent a bowel prep for your procedure, you may not have a normal bowel movement for a few days.  Please Note:  You might notice some irritation and congestion in your nose or some drainage.  This is from the oxygen used during your procedure.  There is no need for concern and it should clear up in a day or so.  SYMPTOMS TO REPORT IMMEDIATELY:  Following lower endoscopy (colonoscopy or flexible sigmoidoscopy):  Excessive amounts of blood in the stool  Significant tenderness or worsening of abdominal pains  Swelling of the abdomen that is new, acute  Fever of 100F or higher  Following upper endoscopy (EGD)  Vomiting of blood or coffee ground material  New chest pain or pain under the shoulder blades  Painful or persistently difficult swallowing  New shortness of breath  Fever of 100F or higher  Black,  tarry-looking stools  For urgent or emergent issues, a gastroenterologist can be reached at any hour by calling [phone]. Do not use MyChart messaging for urgent concerns.    DIET:  We do recommend a small meal at first, but then you may proceed to your regular diet.  Drink plenty of fluids but you should avoid alcoholic beverages for 24 hours.  ACTIVITY:  You should plan to take it easy for the rest of today and you should NOT DRIVE or use heavy machinery until tomorrow (because of the sedation medicines used during the test).    FOLLOW UP: Our staff will call the number listed on your records 48-72 hours following your procedure to check on you and address any questions or concerns that you may have regarding the information given to you following your procedure. If we do not reach you, we will leave a message.  We will attempt to reach you two times.  During this call, we will ask if you have developed any symptoms of COVID 19. If you develop any symptoms (ie: fever, flu-like symptoms, shortness of breath, cough etc.) before then, please call [phone].  If you test positive for Covid 19 in the 2 weeks post procedure, please call and report this information to [place].    If any biopsies were taken you will be contacted by phone or by letter within the next 1-3 weeks.  Please call us at [phone] if you have not heard about the biopsies in 3 weeks.  SIGNATURES/CONFIDENTIALITY: You and/or your care partner have signed paperwork which will be entered into your electronic medical record.  These signatures attest to the fact that that the information above on your After Visit Summary has been reviewed and is understood.  Full responsibility of the confidentiality of this discharge information lies with you and/or your care-partner.

## 2022-05-17 ENCOUNTER — Telehealth: Payer: Self-pay

## 2022-05-17 NOTE — Telephone Encounter (Signed)
  Follow up Call-     05/16/2022   12:42 PM  Call back number  Post procedure Call Back phone  # [phone]  Permission to leave phone message Yes     Patient questions:  Do you have a fever, pain , or abdominal swelling? No. Pain Score  0 *  Have you tolerated food without any problems? Yes.    Have you been able to return to your normal activities? Yes.    Do you have any questions about your discharge instructions: Diet   No. Medications  No. Follow up visit  No.  Do you have questions or concerns about your Care? No.  Actions: * If pain score is 4 or above: No action needed, pain <4.

## 2022-07-22 ENCOUNTER — Encounter: Payer: Self-pay | Admitting: Gastroenterology

## 2022-09-24 ENCOUNTER — Encounter: Payer: Self-pay | Admitting: Gastroenterology

## 2022-09-24 ENCOUNTER — Ambulatory Visit (INDEPENDENT_AMBULATORY_CARE_PROVIDER_SITE_OTHER): Payer: Medicare Other | Admitting: Gastroenterology

## 2022-09-24 VITALS — BP 130/80 | HR 76 | Ht 70.0 in | Wt 169.0 lb

## 2022-09-24 DIAGNOSIS — K2270 Barrett's esophagus without dysplasia: Secondary | ICD-10-CM

## 2022-09-24 DIAGNOSIS — Z79899 Other long term (current) drug therapy: Secondary | ICD-10-CM | POA: Diagnosis not present

## 2022-09-24 DIAGNOSIS — Z800 Family history of malignant neoplasm of digestive organs: Secondary | ICD-10-CM

## 2022-09-24 NOTE — Progress Notes (Signed)
HPI :  76 year old male here for follow-up visit, he has a history of GERD, gastric polyps, recently diagnosed Barrett's, family history of colon cancer.  Recall longstanding history of reflux on Dexilant previously.  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  He ultimately had his hiatal hernia repaired by general surgery a few years ago.  He was off all PPIs and feeling well.  We did a surveillance EGD this past May.  The gastric polyps regressed off PPI, however he had a loose wrap, recurrence of small hiatal hernia and unfortunately a 2 cm segment of newly diagnosed Barrett's.  Biopsies were taken and showed no dysplasia.  He was placed back on low-dose omeprazole 20 mg daily.  He states he really was not having much reflux symptoms while off PPI, previously he had significant symptoms prior to his hernia being repaired.  He has been tolerating the omeprazole well.  No heartburn or dysphagia.  No abdominal pains.  He is eating well.  He denies any history of CKD or osteopenia/porosis.  He had a surveillance colonoscopy for his history of polyps and family history of colon cancer at the same time in May.  He had 2 small benign polyps without any concerning pathology.  Recommended that if he wished to have ongoing surveillance exams given his strong family history of colon cancer (father diagnosed age 74) and he would be due again in 45 years at age 29 if he was otherwise in good health.  He otherwise denies complaints today and is feeling well on the current regimen.     EGD 09/22/2013 - irregular z-line, multiple inflamed gastric polyps, hiatal hernia - fundic gland polyps, no evidence of Barrett's Colonoscopy 09/27/2013 - hyperplastic rectal polyp     EGD 04/02/2018 -  - A 5 cm hiatal hernia was present. - LA Grade B esophagitis was found at the GEJ. - The Z-line was irregular  with a 1cm tongue of salmon colored mucosa without nodularity and was found 34 cm from the incisors. Biopsies were taken with a cold forceps for histology. - The exam of the esophagus was otherwise normal. - Multiple large sessile polyps were found in the gastric fundus and in the gastric body (largest 68m-20mm in size). Biopsies were taken with a cold forceps for histology. - The exam of the stomach was otherwise normal. - Localized nodular mucosa was found in the duodenal bulb consistent with benign ectopic gastric mucosa. Biopsies were taken with a cold forceps for histology. - The exam of the duodenum was otherwise normal.   1. Surgical [P], duodenal polyp - GASTRIC HETEROTOPIA. - NO DYSPLASIA OR MALIGNANCY. 2. Surgical [P], GE junction - REFLUX CHANGES. - NO INTESTINAL METAPLASIA, DYSPLASIA, OR MALIGNANCY. 3. Surgical [P], gastric polyps - FUNDIC GLAND POLYP. - NEGATIVE FOR HELICOBACTER PYLORI. - NO INTESTINAL METAPLASIA, DYSPLASIA, OR MALIGNANCY.       EGD 08/24/2018: Z-line, 35 cm from the incisors. - Very mild esophagitis at the GEJ, improved since previous exam post surgery. - A Toupet fundoplication was found. The wrap appears intact. - Multiple gastric polyps. The largest were resected and retrieved as above. One prophylactic clip was placed to prevent bleeding. 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   Stomach, polyp(s) - HYPERPLASTIC POLYPS WITH FOCAL INTESTINAL METAPLASIA. - NO DYSPLASIA OR MALIGNANCY.     Colonoscopy 11/04/2018:The perianal  and digital rectal examinations were normal. - A 3 mm polyp was found in the cecum. The polyp was sessile. The polyp was removed with a cold snare. Resection and retrieval were complete. - Two sessile polyps were found in the transverse colon. The polyps were 3 to 5 mm in size. These polyps were removed with a cold snare. Resection and retrieval were complete. - Internal hemorrhoids  were found during retroflexion. - The exam was otherwise without abnormality.   Surgical [P], cecum and transverse, polyp (3) - TUBULAR ADENOMA(S). - SESSILE SERRATED POLYP WITHOUT CYTOLOGIC DYSPLASIA. - HIGH GRADE DYSPLASIA IS NOT IDENTIFIED.      EGD 05/16/22: - A 3 cm hiatal hernia was present. - There were esophageal mucosal changes classified as suspected Barrett's stage C0-M2 per [place] criteria present in the lower third of the esophagus. The maximum longitudinal extent of these mucosal changes was 2 cm in length. Mucosa was biopsied with a cold forceps for histology. - The exam of the esophagus was otherwise normal. - Evidence of a Toupet fundoplication was found in the cardia. The wrap appeared loose. The proximal stomach could not retain air well at all, retroflexed views of the cardia / fundus were limited due to lack of air retention. - Patchy mildly erythematous mucosa was found in the gastric body. Biopsies were taken with a cold forceps for Helicobacter pylori testing. - The exam of the stomach was otherwise normal. Interval resolution of gastric polyps off PPI - Nodular mucosa was found in the duodenal bulb consistent with benign ectopic gastric mucosa. - The exam of the duodenum was otherwise normal.  It appears the patient has likely developed a short segment of Barrett's while off PPI, in setting of loosening of fundoplication. While gastric polyps have resolved, I do think he would benefit from resumption of PPI. I will discuss with him  Colonoscopy 05/16/22: The perianal and digital rectal examinations were normal. - A 2 mm polyp was found in the cecum. The polyp was flat. The polyp was removed with a cold snare. Resection and retrieval were complete. - A 3 mm polyp was found in the rectum. The polyp was sessile. The polyp was removed with a cold snare. Resection and retrieval were complete. - A few small-mouthed diverticula were found in the ascending  colon. - Internal hemorrhoids were found during retroflexion. The hemorrhoids were small. - The exam was otherwise without abnormality.  1. Surgical [P], gastric antrum and gastric body - ANTRAL AND OXYNTIC MUCOSA WITH MILD CHANGES OF REACTIVE GASTROPATHY. - NO HELICOBACTER PYLORI IDENTIFIED. 2. Surgical [P], distal esophagus - GASTROESOPHAGEAL MUCOSA WITH INTESTINAL METAPLASIA CONSISTENT WITH BARRETT'S ESOPHAGUS. - NEGATIVE FOR DYSPLASIA. 3. Surgical [P], colon, cecum, polyp (1) - COLONIC MUCOSA WITH BENIGN LYMPHOID AGGREGATE. - MULTIPLE ADDITIONAL LEVELS EXAMINED. 4. Surgical [P], colon, rectum, polyp (1) - HYPERPLASTIC POLYP (S).    Past Medical History:  Diagnosis Date   Barrett's esophagus    Chronic cholecystitis    GERD (gastroesophageal reflux disease)    in past/ off medication   Gouty arthritis of right great toe    finished prednisone treatment 05-05-2018--  per pt resolved   Heel fracture 2021   Left   Hiatal hernia    History of colon polyps    HOH (hard of hearing)      Past Surgical History:  Procedure Laterality Date   CHOLECYSTECTOMY     COLONOSCOPY  09/27/2013   ESOPHAGEAL MANOMETRY N/A 04/10/2018   Procedure: ESOPHAGEAL MANOMETRY (EM);  Surgeon: Mauri Pole, MD;  Location: [facility];  Service: Endoscopy;  Laterality: N/A;   ESOPHAGOGASTRODUODENOSCOPY (EGD) WITH PROPOFOL N/A 08/24/2018   Procedure: ESOPHAGOGASTRODUODENOSCOPY (EGD) WITH PROPOFOL;  Surgeon: Yetta Flock, MD;  Location: [facility];  Service: Gastroenterology;  Laterality: N/A;   LAPAROSCOPIC NISSEN FUNDOPLICATION  45/80/9983   LEFT HEART CATHETERIZATION WITH CORONARY/GRAFT ANGIOGRAM N/A 05/24/2014   Procedure: LEFT HEART CATHETERIZATION WITH Beatrix Fetters;  Surgeon: Birdie Riddle, MD;  Location: [facility];  Service: Cardiovascular;  Laterality: N/A; minimal luminal irreglarities involving pLAD and LCFx, otherwise normal coronary arteries, normal LVSF (ef  60-65%)and LVEDP (75mHg)   POLYPECTOMY  08/24/2018   Procedure: POLYPECTOMY & CLIP PLACEMENT;  Surgeon: AYetta Flock MD;  Location: [facility];  Service: Gastroenterology;;   ROBOTIC ASSISTED LAPAROSCOPIC CHOLECYSTECTOMY N/A 05/13/2018   Procedure: ROBOTIC ASSISTED LAPAROSCOPIC CHOLECYSTECTOMY;  Surgeon: GMichael Boston MD;  Location: [facility];  Service: General;  Laterality: N/A;   [facility]   Family History  Problem Relation Age of Onset   Colon cancer Father 619  Esophageal cancer Father    Colon polyps Father    Heart disease Father    Hypertension Mother    Stroke Mother    Cancer Maternal Grandfather        mets   Rectal cancer Neg Hx    Stomach cancer Neg Hx    Social History   Tobacco Use   Smoking status: Former    Types: Pipe    Start date: 12/30/1978    Quit date: 05/05/1984    Years since quitting: 38.4   Smokeless tobacco: Never  Vaping Use   Vaping Use: Never used  Substance Use Topics   Alcohol use: Yes    Alcohol/week: 4.0 - 5.0 standard drinks of alcohol    Types: 4 - 5 Cans of beer per week   Drug use: No   Current Outpatient Medications  Medication Sig Dispense Refill   ciprofloxacin-dexamethasone (CIPRODEX) OTIC suspension 4 drops as needed. In left ear     Multiple Vitamin (MULTIVITAMIN) tablet Take 1 tablet by mouth daily.     omeprazole (PRILOSEC) 20 MG capsule Take 1 capsule (20 mg total) by mouth daily. 90 capsule 3   No current facility-administered medications for this visit.   No Known Allergies   Review of Systems: All systems reviewed and negative except where noted in HPI.    No results found.  Physical Exam: BP 130/80   Pulse 76   Ht '5\' 10"'$  (1.778 m)   Wt 169 lb (76.7 kg)   BMI 24.25 kg/m  Constitutional: Pleasant,well-developed, male in no acute distress. Neurological: Alert and oriented to person place and time. Psychiatric: Normal  mood and affect. Behavior is normal.   ASSESSMENT: 76y.o. male here for assessment of the following  1. Barrett's esophagus without dysplasia   2. Long-term current use of proton pump inhibitor therapy   3. Family history of malignant neoplasm of gastrointestinal tract    As above, previously large hiatal hernia that led to reflux.  He developed large fundic gland polyps in the setting of PPI use.  Status post hiatal hernia repair and off PPI, his fundic land polyps resolved however unfortunately his wrap became loosened with recurrent smaller hiatal hernia and he developed a short segment of Barrett's esophagus.  We discussed Barrett's esophagus, risks for esophagus cancer over time, hopefully that risk  is low.  Given this finding we resumed lower dose PPI, he is on omeprazole 20 mg daily, in hopes of that stops progression of his Barrett's and reduce his risk for esophagus cancer.  Recommend surveillance endoscopy in 3 to 5 years to reassess the site assuming no dysphagia or problems with reflux in the interim.  He is agreeable with this and understands.  I discussed long-term PPI use with him in general, risks of CKD, bone fracture etc., using lowest dose needed to control his symptoms and reduce risk for esophagus cancer.  He will see me once yearly if not sooner with any issues.  Otherwise colonoscopy showed no high risk lesions.  Given his family history of colon cancer could consider another colonoscopy in 5 years if he is otherwise fit and wishes to have 1 final exam prior to stopping.  We will discuss it with him at that time.   PLAN: - continue omeprazole '20mg'$  / day, discussed risks benefits - repeat EGD in 3-5 years - repeat colonoscopy in 5 years if he is feeling well - f/u in the office in one year  Jolly Mango, MD [facility]

## 2022-09-24 NOTE — Patient Instructions (Addendum)
If you are age 74 or older, your body mass index should be between 23-30. Your Body mass index is 24.25 kg/m. If this is out of the aforementioned range listed, please consider follow up with your Primary Care Provider.  If you are age 69 or younger, your body mass index should be between 19-25. Your Body mass index is 24.25 kg/m. If this is out of the aformentioned range listed, please consider follow up with your Primary Care Provider.   ________________________________________________________  Continue omeprazole 20 mg once daily.  Please follow up in 1 year.  You will be due for a recall EGD in 04-2025 and a colonoscopy in 04-2027. We will send you a reminder in the mail when it gets closer to that time.  Thank you for entrusting me with your care and for choosing [facility], Dr.  Cellar

## 2023-01-17 ENCOUNTER — Other Ambulatory Visit: Payer: Self-pay | Admitting: Otolaryngology

## 2023-07-31 ENCOUNTER — Encounter: Payer: Self-pay | Admitting: Gastroenterology

## 2023-07-31 ENCOUNTER — Other Ambulatory Visit (INDEPENDENT_AMBULATORY_CARE_PROVIDER_SITE_OTHER): Payer: Medicare Other

## 2023-07-31 ENCOUNTER — Ambulatory Visit: Payer: Medicare Other | Admitting: Gastroenterology

## 2023-07-31 VITALS — BP 120/70 | HR 58 | Ht 71.0 in | Wt 170.0 lb

## 2023-07-31 DIAGNOSIS — K2270 Barrett's esophagus without dysplasia: Secondary | ICD-10-CM | POA: Diagnosis not present

## 2023-07-31 DIAGNOSIS — R944 Abnormal results of kidney function studies: Secondary | ICD-10-CM | POA: Diagnosis not present

## 2023-07-31 DIAGNOSIS — K317 Polyp of stomach and duodenum: Secondary | ICD-10-CM

## 2023-07-31 DIAGNOSIS — N289 Disorder of kidney and ureter, unspecified: Secondary | ICD-10-CM | POA: Diagnosis not present

## 2023-07-31 DIAGNOSIS — K219 Gastro-esophageal reflux disease without esophagitis: Secondary | ICD-10-CM

## 2023-07-31 DIAGNOSIS — Z79899 Other long term (current) drug therapy: Secondary | ICD-10-CM | POA: Diagnosis not present

## 2023-07-31 LAB — BASIC METABOLIC PANEL
BUN: 16 mg/dL (ref 6–23)
CO2: 27 mEq/L (ref 19–32)
Calcium: 9.3 mg/dL (ref 8.4–10.5)
Chloride: 103 mEq/L (ref 96–112)
Creatinine, Ser: 1.38 mg/dL (ref 0.40–1.50)
GFR: 50.03 mL/min — ABNORMAL LOW (ref >60.00)
Glucose, Bld: 105 mg/dL — ABNORMAL HIGH (ref 70–99)
Potassium: 4.2 mEq/L (ref 3.5–5.1)
Sodium: 138 mEq/L (ref 135–145)

## 2023-07-31 MED ORDER — OMEPRAZOLE 20 MG PO CPDR: 20.0000 mg | DELAYED_RELEASE_CAPSULE | Freq: Every day | ORAL | 3 refills | Status: DC

## 2023-07-31 NOTE — Patient Instructions (Signed)
Please go to the lab in the basement of our building to have lab work done as you leave today. Hit "B" for basement when you get on the elevator.  When the doors open the lab is on your left.  We will call you with the results. Thank you.  We have sent the following medications to your pharmacy for you to pick up at your convenience: Omeprazole  You will be due for a recall EGD and colonoscopy in 04-2027. We will send you a reminder in the mail when it gets closer to that time.  Thank you for entrusting me with your care and for choosing [facility], Dr. Ileene Patrick   If your blood pressure at your visit was 140/90 or greater, please contact your primary care physician to follow up on this. ______________________________________________________  If you are age 70 or older, your body mass index should be between 23-30. Your Body mass index is 23.71 kg/m. If this is out of the aforementioned range listed, please consider follow up with your Primary Care Provider.  If you are age 75 or younger, your body mass index should be between 19-25. Your Body mass index is 23.71 kg/m. If this is out of the aformentioned range listed, please consider follow up with your Primary Care Provider.  ________________________________________________________  The [facility] providers would like to encourage you to use [facility] to communicate with providers for non-urgent requests or questions.  Due to long hold times on the telephone, sending your provider a message by [facility] may be a faster and more efficient way to get a response.  Please allow 48 business hours for a response.  Please remember that this is for non-urgent requests.  _______________________________________________________  Due to recent changes in healthcare laws, you may see the results of your imaging and laboratory studies on MyChart before your provider has had a chance to review them.  We understand that in some cases there may be  results that are confusing or concerning to you. Not all laboratory results come back in the same time frame and the provider may be waiting for multiple results in order to interpret others.  Please give [place] 48 hours in order for your provider to thoroughly review all the results before contacting the office for clarification of your results.

## 2023-07-31 NOTE — Progress Notes (Signed)
HPI :  77 year old male here for follow-up for Barrett's esophagus, history of GERD.  Recall he has been on PPI longstanding in the past, previously Dexilant, in the setting of large hiatal hernia with multiple large fundic gland polyps.  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  He ended up having hiatal hernia repair by surgery around that time.  He was able to come off PPIs for few years and felt well without symptoms.  Unfortunately his symptoms came back last year, he had an EGD in May 2023.  The gastric polyps regressed off PPI, unfortunately he had recurrence of a small hiatal hernia in the fundoplication was loose.  In addition to this he had a 2 cm segment of newly diagnosed Barrett's without dysplasia.  He was placed back on omeprazole 20 mg daily.  At the time he denied any CKD or problems with his bones.  Since I have last seen him he has been feeling really well.  He takes omeprazole 20 mg daily and this works well for him.  He states when he takes that he has absolutely no breakthrough GERD or regurgitation.  No dysphagia.  He is eating well, no nausea or vomiting, no abdominal pain.  Overall denies any problems with the regimen.  On review of his labs today, he had blood work done on April 16 of this year with a creatinine of 1.69, this is higher than previous baseline of 1.3 back in 2022.  He has not had it repeated.  We discussed this for a bit.  He does not take many medications.  Otherwise recall his father had colon cancer diagnosed at age 61.  His last exam was in May 2023 with a few polyps.  He denies any problems with his bowels.  Prior workup: EGD 09/22/2013 - irregular z-line, multiple inflamed gastric polyps, hiatal hernia - fundic gland polyps, no evidence of Barrett's Colonoscopy 09/27/2013 - hyperplastic rectal polyp     EGD 04/02/2018 -  - A 5 cm hiatal hernia was present. - LA Grade B esophagitis was found at the  GEJ. - The Z-line was irregular with a 1cm tongue of salmon colored mucosa without nodularity and was found 34 cm from the incisors. Biopsies were taken with a cold forceps for histology. - The exam of the esophagus was otherwise normal. - Multiple large sessile polyps were found in the gastric fundus and in the gastric body (largest 37mm-20mm in size). Biopsies were taken with a cold forceps for histology. - The exam of the stomach was otherwise normal. - Localized nodular mucosa was found in the duodenal bulb consistent with benign ectopic gastric mucosa. Biopsies were taken with a cold forceps for histology. - The exam of the duodenum was otherwise normal.   1. Surgical [P], duodenal polyp - GASTRIC HETEROTOPIA. - NO DYSPLASIA OR MALIGNANCY. 2. Surgical [P], GE junction - REFLUX CHANGES. - NO INTESTINAL METAPLASIA, DYSPLASIA, OR MALIGNANCY. 3. Surgical [P], gastric polyps - FUNDIC GLAND POLYP. - NEGATIVE FOR HELICOBACTER PYLORI. - NO INTESTINAL METAPLASIA, DYSPLASIA, OR MALIGNANCY.       EGD 08/24/2018: Z-line, 35 cm from the incisors. - Very mild esophagitis at the GEJ, improved since previous exam post surgery. - A Toupet fundoplication was found. The wrap appears intact. - Multiple gastric polyps. The largest were resected and retrieved as above. One prophylactic clip was placed to prevent bleeding. - Nodular mucosa in the duodenal bulb consistent with known ectopic  gastric mucosa.   Stomach, polyp(s) - HYPERPLASTIC POLYPS WITH FOCAL INTESTINAL METAPLASIA. - NO DYSPLASIA OR MALIGNANCY.     Colonoscopy 11/04/2018: The perianal and digital rectal examinations were normal. - A 3 mm polyp was found in the cecum. The polyp was sessile. The polyp was removed with a cold snare. Resection and retrieval were complete. - Two sessile polyps were found in the transverse colon. The polyps were 3 to 5 mm in size. These polyps were removed with a cold snare. Resection and retrieval  were complete. - Internal hemorrhoids were found during retroflexion. - The exam was otherwise without abnormality.   Surgical [P], cecum and transverse, polyp (3) - TUBULAR ADENOMA(S). - SESSILE SERRATED POLYP WITHOUT CYTOLOGIC DYSPLASIA. - HIGH GRADE DYSPLASIA IS NOT IDENTIFIED.       EGD 05/16/22: - A 3 cm hiatal hernia was present. - There were esophageal mucosal changes classified as suspected Barrett's stage C0-M2 per Prague criteria present in the lower third of the esophagus. The maximum longitudinal extent of these mucosal changes was 2 cm in length. Mucosa was biopsied with a cold forceps for histology. - The exam of the esophagus was otherwise normal. - Evidence of a Toupet fundoplication was found in the cardia. The wrap appeared loose. The proximal stomach could not retain air well at all, retroflexed views of the cardia / fundus were limited due to lack of air retention. - Patchy mildly erythematous mucosa was found in the gastric body. Biopsies were taken with a cold forceps for Helicobacter pylori testing. - The exam of the stomach was otherwise normal. Interval resolution of gastric polyps off PPI - Nodular mucosa was found in the duodenal bulb consistent with benign ectopic gastric mucosa. - The exam of the duodenum was otherwise normal.   It appears the patient has likely developed a short segment of Barrett's while off PPI, in setting of loosening of fundoplication. While gastric polyps have resolved, I do think he would benefit from resumption of PPI. I will discuss with him   Colonoscopy 05/16/22: The perianal and digital rectal examinations were normal. - A 2 mm polyp was found in the cecum. The polyp was flat. The polyp was removed with a cold snare. Resection and retrieval were complete. - A 3 mm polyp was found in the rectum. The polyp was sessile. The polyp was removed with a cold snare. Resection and retrieval were complete. - A few small-mouthed  diverticula were found in the ascending colon. - Internal hemorrhoids were found during retroflexion. The hemorrhoids were small. - The exam was otherwise without abnormality.   1. Surgical [P], gastric antrum and gastric body - ANTRAL AND OXYNTIC MUCOSA WITH MILD CHANGES OF REACTIVE GASTROPATHY. - NO HELICOBACTER PYLORI IDENTIFIED. 2. Surgical [P], distal esophagus - GASTROESOPHAGEAL MUCOSA WITH INTESTINAL METAPLASIA CONSISTENT WITH BARRETT'S ESOPHAGUS. - NEGATIVE FOR DYSPLASIA. 3. Surgical [P], colon, cecum, polyp (1) - COLONIC MUCOSA WITH BENIGN LYMPHOID AGGREGATE. - MULTIPLE ADDITIONAL LEVELS EXAMINED. 4. Surgical [P], colon, rectum, polyp (1) - HYPERPLASTIC POLYP (S).   Past Medical History:  Diagnosis Date   Barrett's esophagus    Chronic cholecystitis    GERD (gastroesophageal reflux disease)    in past/ off medication   Gouty arthritis of right great toe    finished prednisone treatment 05-05-2018--  per pt resolved   Heel fracture 2021   Left   Hiatal hernia    History of colon polyps    HOH (hard of hearing)      Past  Surgical History:  Procedure Laterality Date   CHOLECYSTECTOMY     COLONOSCOPY  09/27/2013   ESOPHAGEAL MANOMETRY N/A 04/10/2018   Procedure: ESOPHAGEAL MANOMETRY (EM);  Surgeon: Napoleon Form, MD;  Location: [facility];  Service: Endoscopy;  Laterality: N/A;   ESOPHAGOGASTRODUODENOSCOPY (EGD) WITH PROPOFOL N/A 08/24/2018   Procedure: ESOPHAGOGASTRODUODENOSCOPY (EGD) WITH PROPOFOL;  Surgeon: Benancio Deeds, MD;  Location: [facility];  Service: Gastroenterology;  Laterality: N/A;   LAPAROSCOPIC NISSEN FUNDOPLICATION  05/13/2018   LEFT HEART CATHETERIZATION WITH CORONARY/GRAFT ANGIOGRAM N/A 05/24/2014   Procedure: LEFT HEART CATHETERIZATION WITH Isabel Caprice;  Surgeon: Ricki Rodriguez, MD;  Location: [facility];  Service: Cardiovascular;  Laterality: N/A; minimal luminal irreglarities involving pLAD and LCFx, otherwise normal  coronary arteries, normal LVSF (ef 60-65%)and LVEDP ( )   POLYPECTOMY  08/24/2018   Procedure: POLYPECTOMY & CLIP PLACEMENT;  Surgeon: Benancio Deeds, MD;  Location: [facility];  Service: Gastroenterology;;   ROBOTIC ASSISTED LAPAROSCOPIC CHOLECYSTECTOMY N/A 05/13/2018   Procedure: ROBOTIC ASSISTED LAPAROSCOPIC CHOLECYSTECTOMY;  Surgeon: Karie Soda, MD;  Location: [facility];  Service: General;  Laterality: N/A;   STAPEDES SURGERY Right 1986   TONSILLECTOMY  1954   UPPER GASTROINTESTINAL ENDOSCOPY  03/2018   Family History  Problem Relation Age of Onset   Colon cancer Father 12   Esophageal cancer Father    Colon polyps Father    Heart disease Father    Hypertension Mother    Stroke Mother    Cancer Maternal Grandfather        mets   Rectal cancer Neg Hx    Stomach cancer Neg Hx    Social History   Tobacco Use   Smoking status: Former    Types: Pipe    Start date: 12/30/1978    Quit date: 05/05/1984    Years since quitting: 39.2   Smokeless tobacco: Never  Vaping Use   Vaping status: Never Used  Substance Use Topics   Alcohol use: Yes    Alcohol/week: 4.0 - 5.0 standard drinks of alcohol    Types: 4 - 5 Cans of beer per week   Drug use: No   Current Outpatient Medications  Medication Sig Dispense Refill   ciprofloxacin-dexamethasone (CIPRODEX) OTIC suspension 4 drops as needed. In left ear     Multiple Vitamin (MULTIVITAMIN) tablet Take 1 tablet by mouth daily.     omeprazole (PRILOSEC) 20 MG capsule Take 1 capsule (20 mg total) by mouth daily. 90 capsule 3   No current facility-administered medications for this visit.   No Known Allergies   Review of Systems: All systems reviewed and negative except where noted in HPI.   Labs reviewed in care everywhere  Physical Exam: BP 120/70   Pulse (!) 58   Ht 5\' 11"  (1.803 m)   Wt 170 lb (77.1 kg)   BMI 23.71 kg/m  Constitutional: Pleasant,well-developed, male in no acute distress. Neurological: Alert and  oriented to person place and time. Skin: Skin is warm and dry. No rashes noted. Psychiatric: Normal mood and affect. Behavior is normal.   ASSESSMENT: 77 y.o. male here for assessment of the following  1. Barrett's esophagus without dysplasia   2. Gastroesophageal reflux disease, unspecified whether esophagitis present   3. Long-term current use of proton pump inhibitor therapy   4. Abnormal kidney function    History as above, previously with large hiatal hernia that led to fundoplication.  Unfortunately fundoplication loosened and he had recurrent symptoms and unfortunately developed a short  segment of Barrett's.  Has been on omeprazole 20 mg daily now for the past year and his reflux symptoms have completely resolved and he feels well.  Biopsies showed no high risk lesions, no dysplasia within the segment.  I discussed Barrett's esophagus with him in general, long-term risks for malignancy.  Fortunately this is a short segment and his symptoms are controlled on PPI.  We then discussed long-term risks of chronic PPI use to include increased risk for CKD, increased bone fracture risk, C. difficile, etc.  Unfortunately it looks like his last kidney function test showed rising creatinine compared to previous baseline, this has not been repeated, done roughly 4 months ago.  Will repeat renal function today.  If his creatinine is worsening or rising, may need to have him see nephrology.  Ideally we would continue PPI indefinitely given his history of Barrett's to reduce his esophageal cancer risk.  He understands the issues at play here.  Will get his labs back and make further recommendations.  Otherwise based on updated guidelines, his next EGD is due 5 years from his last exam as long as he is asymptomatic.  Can consider colonoscopy at the same time if he wishes to continue surveillance at that age for history of polyps and family history of colon cancer.  PLAN: - continue omeprazole 20mg  / day  for now - lab for BMET - trend creatinine - may need nephrology input if worsening - recall EGD 04/2027 - recall colonoscopy 04/2027 - follow up yearly  Harlin Rain, MD [facility]

## 2024-07-22 ENCOUNTER — Other Ambulatory Visit: Payer: Self-pay | Admitting: Gastroenterology

## 2024-07-22 DIAGNOSIS — K317 Polyp of stomach and duodenum: Secondary | ICD-10-CM

## 2024-10-05 ENCOUNTER — Other Ambulatory Visit: Payer: Self-pay

## 2024-10-05 ENCOUNTER — Emergency Department (HOSPITAL_COMMUNITY)
Admission: EM | Admit: 2024-10-05 | Discharge: 2024-10-05 | Disposition: A | Discharge status: Home / Self Care | Attending: Emergency Medicine | Admitting: Emergency Medicine

## 2024-10-05 ENCOUNTER — Emergency Department (HOSPITAL_COMMUNITY)

## 2024-10-05 ENCOUNTER — Encounter (HOSPITAL_COMMUNITY): Payer: Self-pay

## 2024-10-05 ENCOUNTER — Telehealth: Payer: Self-pay | Admitting: Orthopedic Surgery

## 2024-10-05 DIAGNOSIS — S52502A Unspecified fracture of the lower end of left radius, initial encounter for closed fracture: Secondary | ICD-10-CM | POA: Insufficient documentation

## 2024-10-05 DIAGNOSIS — W11XXXA Fall on and from ladder, initial encounter: Secondary | ICD-10-CM | POA: Insufficient documentation

## 2024-10-05 DIAGNOSIS — S41111A Laceration without foreign body of right upper arm, initial encounter: Secondary | ICD-10-CM | POA: Insufficient documentation

## 2024-10-05 LAB — CBC WITH DIFFERENTIAL/PLATELET

## 2024-10-05 LAB — BASIC METABOLIC PANEL WITH GFR
Anion gap: 12 (ref 5–15)
BUN: 26 mg/dL — ABNORMAL HIGH (ref 8–23)
CO2: 17 mmol/L — ABNORMAL LOW (ref 22–32)
Calcium: 8.3 mg/dL — ABNORMAL LOW (ref 8.9–10.3)
Chloride: 109 mmol/L (ref 98–111)
Creatinine, Ser: 1.73 mg/dL — ABNORMAL HIGH (ref 0.61–1.24)
GFR, Estimated: 40 mL/min — ABNORMAL LOW (ref >60)
Glucose, Bld: 133 mg/dL — ABNORMAL HIGH (ref 70–99)
Potassium: 3.6 mmol/L (ref 3.5–5.1)
Sodium: 138 mmol/L (ref 135–145)

## 2024-10-05 MED ORDER — OXYCODONE-ACETAMINOPHEN 5-325 MG PO TABS: 1.0000 | ORAL_TABLET | Freq: Four times a day (QID) | ORAL | 0 refills | Status: AC | PRN

## 2024-10-05 MED ORDER — LIDOCAINE HCL 2 % IJ SOLN
10.0000 mL | Freq: Once | INTRAMUSCULAR | Status: AC
  Administered 2024-10-05: 200 mg
  Filled 2024-10-05: qty 20

## 2024-10-05 MED ORDER — MORPHINE SULFATE (PF) 2 MG/ML IV SOLN
4.0000 mg | Freq: Once | INTRAVENOUS | Status: AC
  Administered 2024-10-05: 4 mg via INTRAVENOUS
  Filled 2024-10-05: qty 2

## 2024-10-05 NOTE — ED Notes (Signed)
 Ortho tech at bedside splinting patients left arm.

## 2024-10-05 NOTE — ED Provider Notes (Signed)
 [facility] Provider Note   CSN: [id number] Arrival date & time: 10/05/24  1546     Patient presents with: Wrist Pain   Ronald Mccormick is a 78 y.o. male here presenting with left wrist injury.  Patient states that he was on top of a ladder and was trying to cut down tree limb and the tree limb fell and he also fell with it.  He states that he landed on the left wrist and also skinned the right arm.  He denies any head injury or loss of consciousness.  Denies any chest pain or abdominal pain.  He was noted to have obvious deformity of the left wrist and was given fentanyl  at 100 mcg by EMS.  Patient's tetanus is up-to-date   The history is provided by the patient.       Prior to Admission medications   Medication Sig Start Date End Date Taking? Authorizing Provider  ciprofloxacin-dexamethasone  (CIPRODEX) OTIC suspension 4 drops as needed. In left ear 03/11/22   [provider]  Multiple Vitamin (MULTIVITAMIN) tablet Take 1 tablet by mouth daily.    [provider]  omeprazole  (PRILOSEC) 20 MG capsule TAKE 1 CAPSULE DAILY 07/23/24   Armbruster, Elspeth SQUIBB, MD    Allergies: Patient has no known allergies.    Review of Systems  Skin:  Positive for wound.  All other systems reviewed and are negative.   Updated Vital Signs BP (!) 142/92 (BP Location: Left Arm)   Pulse 80   Temp (!) 97.2 F (36.2 C) (Oral)   Resp 17   Ht 5' 11 (1.803 m)   Wt 78 kg   SpO2 100%   BMI 23.99 kg/m   Physical Exam Vitals and nursing note reviewed.  Constitutional:      Appearance: Normal appearance.  HENT:     Head: Normocephalic and atraumatic.     Nose: Nose normal.     Mouth/Throat:     Mouth: Mucous membranes are moist.  Eyes:     Extraocular Movements: Extraocular movements intact.     Pupils: Pupils are equal, round, and reactive to light.  Cardiovascular:     Rate and Rhythm: Normal rate and regular rhythm.     Pulses:  Normal pulses.     Heart sounds: Normal heart sounds.  Pulmonary:     Effort: Pulmonary effort is normal.     Breath sounds: Normal breath sounds.  Abdominal:     General: Abdomen is flat.     Palpations: Abdomen is soft.  Musculoskeletal:     Cervical back: Normal range of motion and neck supple.     Comments: Patient has obvious deformity of the left distal radius.  Patient also has a abrasion on the volar aspect of it.  Patient has no forearm or elbow or upper arm tenderness of the left arm.  Patient has a skin tear of the right humerus with no obvious bony tenderness  Skin:    General: Skin is warm.  Neurological:     General: No focal deficit present.     Mental Status: He is alert and oriented to person, place, and time.  Psychiatric:        Mood and Affect: Mood normal.        Behavior: Behavior normal.     (all labs ordered are listed, but only abnormal results are displayed) Labs Reviewed  CBC WITH DIFFERENTIAL/PLATELET  BASIC METABOLIC PANEL WITH GFR  EKG: None  Radiology: No results found.   .Reduction of fracture  Date/Time: 10/05/2024 6:14 PM  Performed by: Patt Alm Macho, MD Authorized by: Patt Alm Macho, MD  Consent: Verbal consent obtained Risks and benefits: risks, benefits and alternatives were discussed Consent given by: patient Patient understanding: patient states understanding of the procedure being performed Required items: required blood products, implants, devices, and special equipment available Patient identity confirmed: verbally with patient Preparation: Patient was prepped and draped in the usual sterile fashion. Local anesthesia used: yes Anesthesia: hematoma block  Anesthesia: Local anesthesia used: yes Local Anesthetic: lidocaine  2% without epinephrine  Anesthetic total: 5 mL  Sedation: Patient sedated: no  Patient tolerance: patient tolerated the procedure well with no immediate complications Comments: I used finger  traps and also reduced the fracture with elbow the sugar-tong splint     SPLINT APPLICATION Date/Time: 6:14 PM Authorized by: Alm VEAR Patt Consent: Verbal consent obtained. Risks and benefits: risks, benefits and alternatives were discussed Consent given by: patient Splint applied by: orthopedic technician and myself Location details: L elbow Splint type: sugar tongue Supplies used: fiberglass Post-procedure: The splinted body part was neurovascularly unchanged following the procedure. Patient tolerance: Patient tolerated the procedure well with no immediate complications.      Medications Ordered in the ED  morphine  (PF) 2 MG/ML injection 4 mg (4 mg Intravenous Given 10/05/24 1613)                                    Medical Decision Making Ronald Mccormick is a 78 y.o. male here presenting with fall with left wrist injury.  Patient has obvious deformity of the left wrist.  Will get x-rays and give pain medicine  6:15 PM X-rays showed comminuted fracture of the left radial metaphysis with angulation.  I performed hematoma block and also fingertrap in an attempt to reduce the fracture.  The postreduction film is slightly improved.  I discussed with Dr. Agarwala from hand surgery.  He states that this likely needs surgical repair.  Since patient is neurovascularly intact currently, he agreed with the splint and follow-up in his office.  Patient also has seen increased per Ortho for his ankle fracture previously.  Told wife to call [facility] Ortho or Dr. Agarwala for surgical repair   Problems Addressed: Closed fracture of distal end of left radius, unspecified fracture morphology, initial encounter: acute illness or injury  Amount and/or Complexity of Data Reviewed Labs: ordered. Decision-making details documented in ED Course. Radiology: ordered and independent interpretation performed. Decision-making details documented in ED Course.  Risk Prescription drug  management.     Final diagnoses:  None    ED Discharge Orders     None          Patt Alm Macho, MD 10/05/24 [phone]

## 2024-10-05 NOTE — Telephone Encounter (Signed)
-----   Message from [facility] sent at 10/05/2024  6:06 PM EDT ----- Wrist fracture, looks operative  We can try and see him either tomorrow or Thursday and get him scheduled for surgery

## 2024-10-05 NOTE — ED Triage Notes (Signed)
 Pt was cutting a tree and on ladder. Tree limb fell and so did patient. Pt landed on left side . Denies hitting head, no blood thinners, no LOC. Pt had deformity to left wrist. C/O numbness to fingers, pt has motor function. EMS gave 100 mcgs of fentanyl . Pt has pulses to left wrist. Axox4.

## 2024-10-05 NOTE — Discharge Instructions (Addendum)
 As we discussed you have a wrist fracture  I have prescribed Percocet for severe pain please take Tylenol  or Motrin for pain  Please call Dr. Deanna office tomorrow for appointment.  You may also contact your orthopedic doctor for appointment.  As we discussed you likely need surgical repair  Return to ER if you have severe pain or swelling

## 2024-10-05 NOTE — ED Notes (Signed)
 EDP Patt and Ortho tech placed patient in finger traps.

## 2024-10-05 NOTE — ED Notes (Signed)
 Patients right arm wound cleaned. New dressing applied.

## 2024-10-05 NOTE — Telephone Encounter (Signed)
 Called patient to schedule an appointment with Dr. Erwin regarding his wrist fracture. Dr. Erwin is the on call doctor for hand tonight.  Patient states he has seen [facility] in past and would like to go there. He is going to call them in the morning for appointment. I informed him that if he cannot be seen this week, to give our office a call back and we will see him tomorrow or Thursday.

## 2024-11-01 ENCOUNTER — Encounter: Payer: Self-pay | Admitting: Radiology
